# Patient Record
Sex: Female | Born: 1995 | Race: White | Hispanic: No | Marital: Single | Smoking: Former smoker
Health system: Southern US, Community
[De-identification: ages and names within clinical notes are randomized; demographics above are authoritative.]

## PROBLEM LIST (undated history)

## (undated) DIAGNOSIS — R569 Unspecified convulsions: Secondary | ICD-10-CM

## (undated) HISTORY — PX: NO PAST SURGERIES: SHX2092

---
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
## 2016-06-02 ENCOUNTER — Emergency Department
Admission: EM | Admit: 2016-06-02 | Discharge: 2016-06-02 | Discharge status: Against Medical Advice | Payer: Medicaid Other | Attending: Emergency Medicine | Admitting: Emergency Medicine

## 2016-06-02 DIAGNOSIS — R569 Unspecified convulsions: Secondary | ICD-10-CM

## 2016-06-02 DIAGNOSIS — F17200 Nicotine dependence, unspecified, uncomplicated: Secondary | ICD-10-CM | POA: Insufficient documentation

## 2016-06-02 DIAGNOSIS — G40909 Epilepsy, unspecified, not intractable, without status epilepticus: Secondary | ICD-10-CM | POA: Insufficient documentation

## 2016-06-02 DIAGNOSIS — N9489 Other specified conditions associated with female genital organs and menstrual cycle: Secondary | ICD-10-CM | POA: Insufficient documentation

## 2016-06-02 DIAGNOSIS — Z79899 Other long term (current) drug therapy: Secondary | ICD-10-CM | POA: Insufficient documentation

## 2016-06-02 LAB — HCG, QUANTITATIVE, PREGNANCY: hCG, Beta Chain, Quant, S: <1 m[IU]/mL

## 2016-06-02 MED ORDER — LORAZEPAM 2 MG/ML IJ SOLN
INTRAMUSCULAR | Status: AC
  Administered 2016-06-02: 15:00:00
  Filled 2016-06-02: qty 1

## 2016-06-02 MED ORDER — LACOSAMIDE 100 MG PO TABS: 100.0000 mg | ORAL_TABLET | Freq: Two times a day (BID) | ORAL | 0 refills | Status: DC

## 2016-06-02 MED ORDER — SODIUM CHLORIDE 0.9 % IV SOLN
200.0000 mg | Freq: Once | INTRAVENOUS | Status: AC
  Administered 2016-06-02: 200 mg via INTRAVENOUS
  Filled 2016-06-02: qty 20

## 2016-06-02 NOTE — ED Notes (Signed)
Loud noise heard from patient room. Upon entering room patient having seizure that lasted about 45 seconds.    MD notified and at bedside.  Patient given Ativan 2mg  IV.

## 2016-06-02 NOTE — ED Notes (Signed)
Patient upset and requesting to leave.  Dr. Derrill KayGoodman at bedside talking with patient.

## 2016-06-02 NOTE — ED Notes (Addendum)
Patient alert and oriented upon assessment.  VSS.  Patient admits to having seizure disorder and was being treated with lamictal.  Patient reports stopping lamictal because she did not feel it was working.  Patient verbalized last seizure was about 6 months ago and was about the time she stopped taking lamictal.  Patient with bruised chin from fall post procedure.  Upper lip with small laceration that is not bleeding at this time. Seizure pads in place.

## 2016-06-02 NOTE — ED Triage Notes (Signed)
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 Pt appears "groggy" on arrival but is getting more alert as she is being triaged

## 2016-06-02 NOTE — ED Notes (Signed)
Patient postictal at this time.

## 2016-06-02 NOTE — ED Provider Notes (Signed)
[facility] Emergency Department Provider Note   ____________________________________________   I have reviewed the triage vital signs and the nursing notes.   HISTORY  Chief Complaint Seizures   History limited by: Post ictal   HPI [name] is a 20 y.o. female who presented to the emergency department today because of concern for seizure that happened at work. Unfortunatly the patient was post ictal at the time of my exam. I was called into patient's room when she started having a seizure. It was generalized tonic clonic. Per nursing staff the patient states that she had taken herself off of her lamictal. She was apparently at work when she fell to the ground and had a seizure. Per chart review the patient has been seen in the emergency department multiple times for seizures secondary to non compliance.    Past Medical History:  Diagnosis Date  . Seizures [facility](HCC)     Patient Active Problem List   Diagnosis Date Noted  . Seizure (HCC) 04/21/2015    History reviewed. No pertinent surgical history.  Prior to Admission medications   Medication Sig Start Date End Date Taking? Authorizing Provider  etonogestrel (NEXPLANON) 68 MG IMPL implant 1 each by Subdermal route once.    Historical Provider, MD  lacosamide 100 MG TABS Take 1 tablet (100 mg total) by mouth 2 (two) times daily. Patient not taking: Reported on 12/17/2015 04/22/15   Gale Journeyatherine P Walsh, MD  phenytoin (DILANTIN) 100 MG ER capsule Three tablets by mouth at bedtime every night 12/17/15 12/16/16  Emily FilbertJonathan E Williams, MD    Allergies Review of patient's allergies indicates no known allergies.  Family History  Problem Relation Age of Onset  . Seizures Other   . Hypertension Other     Social History Social History  Substance Use Topics  . Smoking status: Current Every Day Smoker    Packs/day: 1.00  . Smokeless tobacco: Current User  . Alcohol use No    Review of Systems Unable to  obtain secondary to post ictal state  ____________________________________________   PHYSICAL EXAM:  VITAL SIGNS: ED Triage Vitals  Enc Vitals Group     BP 06/02/16 1446 106/64     Pulse Rate 06/02/16 1446 (!) 105     Resp 06/02/16 1446 16     Temp 06/02/16 1446 98.1 F (36.7 C)     Temp Source 06/02/16 1446 Oral     SpO2 06/02/16 1446 98 %     Weight 06/02/16 1454 130 lb (59 kg)     Height 06/02/16 1454 4\' 9"  (1.448 m)   Constitutional: Actively seizing upon my entry to the room. Full body convulsions. This did stop spontaneously. Eyes: Slightly disconjugate gaze. Pupils reactive.  ENT   Head: Normocephalic and atraumatic.   Nose: No congestion/rhinnorhea.   Mouth/Throat: Mucous membranes are moist.   Neck: No stridor. Hematological/Lymphatic/Immunilogical: No cervical lymphadenopathy. Cardiovascular: Normal rate, regular rhythm.  No murmurs, rubs, or gallops. Respiratory: Normal respiratory effort without tachypnea nor retractions. Breath sounds are clear and equal bilaterally. No wheezes/rales/rhonchi. Gastrointestinal: Soft and nontender. No distention.  Genitourinary: Deferred Musculoskeletal: Normal range of motion in all extremities. No lower extremity edema. Neurologic:  Actively seizing initially.  Skin:  Skin is warm, dry and intact. No rash noted.  ____________________________________________    LABS (pertinent positives/negatives)  Labs Reviewed  HCG, QUANTITATIVE, PREGNANCY  CBC WITH DIFFERENTIAL/PLATELET  BASIC METABOLIC PANEL     ____________________________________________   EKG  None  ____________________________________________  RADIOLOGY  None  ____________________________________________   PROCEDURES  Procedures  ____________________________________________   INITIAL IMPRESSION / ASSESSMENT AND PLAN / ED COURSE  Pertinent labs & imaging results that were available during my care of the patient were reviewed by  me and considered in my medical decision making (see chart for details).   Patient presents to the emergency department today after apparent seizure at work. The patient started to have a second seizure here in the emergency department. She was given Ativan. She has not been taking her medication. She will be given IV dose of lamictal. Will plan on getting blood work and watching in the emergency department.  ----------------------------------------- 4:58 PM on 06/02/2016 -----------------------------------------  Called into patient's room at this time because she discussed with the nurse that she would like to leave. I did discuss with patient. I did give patient my concern that she can have a further seizure. I did discuss that this further seizure could result in neurologic damage or death. Patient did voice understanding of my concerns. She did however continued to state that she would like to be discharged. She was willing to stay for paperwork and prescription. I did encourage patient to return for any further seizure or concerning symptoms. ____________________________________________   FINAL CLINICAL IMPRESSION(S) / ED DIAGNOSES  Final diagnoses:  Seizure [facility])     Note: This dictation was prepared with Dragon dictation. Any transcriptional errors that result from this process are unintentional    Phineas Semen, MD 06/02/16 1924

## 2016-06-02 NOTE — ED Notes (Signed)
Patient lethargic but when questioned can answer questions appropriately as to where she is, her name, and year.

## 2016-06-02 NOTE — ED Notes (Signed)
Teresita MaduraLacosta Williamson called and notified nurse that she would like to have patient have drug screenign completed due to work related injury.  Stark BrayLacosta also informed nurse that patient has history of seizures and no longer takes medication for seizures.   Aris Georgiaanya Steinhart informed of this information.

## 2016-06-02 NOTE — Discharge Instructions (Signed)
Please seek medical attention for any high fevers, chest pain, shortness of breath, change in behavior, persistent vomiting, bloody stool or any other new or concerning symptoms.  

## 2016-06-16 ENCOUNTER — Emergency Department: Payer: Medicaid Other

## 2016-06-16 ENCOUNTER — Encounter: Payer: Self-pay | Admitting: Emergency Medicine

## 2016-06-16 ENCOUNTER — Emergency Department
Admission: EM | Admit: 2016-06-16 | Discharge: 2016-06-17 | Disposition: A | Discharge status: Home / Self Care | Payer: Medicaid Other | Attending: Emergency Medicine | Admitting: Emergency Medicine

## 2016-06-16 DIAGNOSIS — G40909 Epilepsy, unspecified, not intractable, without status epilepticus: Secondary | ICD-10-CM | POA: Insufficient documentation

## 2016-06-16 DIAGNOSIS — F17200 Nicotine dependence, unspecified, uncomplicated: Secondary | ICD-10-CM | POA: Insufficient documentation

## 2016-06-16 DIAGNOSIS — R569 Unspecified convulsions: Secondary | ICD-10-CM

## 2016-06-16 NOTE — ED Triage Notes (Signed)
Patient to [facility] via EMS from home after seizure activity.  Per EMS family reports patient is non compliant with seizure medications due to way they make her feel.  Seizure tonight and fell forward striking head on night stand.  Patient was postictal on EMS arrival.

## 2016-06-17 NOTE — ED Provider Notes (Signed)
[facility] Emergency Department Provider Note   ____________________________________________   First MD Initiated Contact with Patient 06/16/16 2326     (approximate)  I have reviewed the triage vital signs and the nursing notes.   HISTORY  Chief Complaint Seizures   HPI [name] is a 20 y.o. female with a history of seizure disorders presented with a seizure. Per her boyfriend, the patient was sitting in a chair when she began to seize. He describes as a generalized seizure which lasted about 2 minutes. The patient denies losing bowel or bladder continence. Says that she is supposed to be on Lamictal but does not take it because she says "I know that my seizures will breakthrough this medication." She says that she is also been on Keppra as well as Dilantin in the past, none of which have worsened. [facility] and says that she'll be following up there. Denying any instigating symptoms such as lack of sleep or stress. Says that she has mild aching to the front of her head where she hit her head on a round table causing oozing and swelling.   Past Medical History:  Diagnosis Date  . Seizures [facility](HCC)     Patient Active Problem List   Diagnosis Date Noted  . Seizure (HCC) 04/21/2015    History reviewed. No pertinent surgical history.  Prior to Admission medications   Medication Sig Start Date End Date Taking? Authorizing Provider  etonogestrel (NEXPLANON) 68 MG IMPL implant 1 each by Subdermal route once.    Historical Provider, MD  Lacosamide 100 MG TABS Take 1 tablet (100 mg total) by mouth 2 (two) times daily. 06/02/16   Phineas SemenGraydon Goodman, MD  phenytoin (DILANTIN) 100 MG ER capsule Three tablets by mouth at bedtime every night 12/17/15 12/16/16  Emily FilbertJonathan E Williams, MD    Allergies Review of patient's allergies indicates no known allergies.  Family History  Problem Relation Age of Onset  . Seizures Other   . Hypertension Other      Social History Social History  Substance Use Topics  . Smoking status: Current Every Day Smoker    Packs/day: 1.00  . Smokeless tobacco: Current User  . Alcohol use Yes    Review of Systems Constitutional: No fever/chills Eyes: No visual changes. ENT: No sore throat. Cardiovascular: Denies chest pain. Respiratory: Denies shortness of breath. Gastrointestinal: No abdominal pain.  No nausea, no vomiting.  No diarrhea.  No constipation. Genitourinary: Negative for dysuria. Musculoskeletal: Negative for back pain. Skin: Negative for rash. Neurological: Negative for focal weakness or numbness.  10-point ROS otherwise negative.  ____________________________________________   PHYSICAL EXAM:  VITAL SIGNS: ED Triage Vitals  Enc Vitals Group     BP 06/16/16 2200 99/63     Pulse Rate 06/16/16 2200 (!) 107     Resp 06/16/16 2200 13     Temp 06/16/16 2204 98.5 F (36.9 C)     Temp Source 06/16/16 2204 Oral     SpO2 06/16/16 2200 100 %     Weight --      Height --      Head Circumference --      Peak Flow --      Pain Score --      Pain Loc --      Pain Edu? --      Excl. in GC? --     Constitutional: Alert and oriented. Well appearing and in no acute distress. Eyes: Conjunctivae  are normal. PERRL. EOMI. Head:4 x 5 cm hematoma that is to the fore head. Small abrasion overlying. No depression. Nose: No congestion/rhinnorhea. Mouth/Throat: Mucous membranes are moist.  Oropharynx non-erythematous. No tongue bite. Neck: No stridor.  No tenderness to midline cervical spine. No deformity or step-off. Cardiovascular: Normal rate, regular rhythm. Heart rate of 65 on the monitor during my exam. Grossly normal heart sounds.   Respiratory: Normal respiratory effort.  No retractions. Lungs CTAB. Gastrointestinal: Soft and nontender. No distention.  Musculoskeletal: No lower extremity tenderness nor edema.  No joint effusions. Neurologic:  Normal speech and language. No gross  focal neurologic deficits are appreciated. Skin:  Skin is warm, dry and intact. No rash noted. Psychiatric: Mood and affect are normal. Speech and behavior are normal.  ____________________________________________   LABS (all labs ordered are listed, but only abnormal results are displayed)  Labs Reviewed - No data to display ____________________________________________  EKG  ED ECG REPORT I, Schaevitz,  Teena Irani, the attending physician, personally viewed and interpreted this ECG.   Date: 06/17/2016  EKG Time: 2154  Rate: 113  Rhythm: sinus tachycardia  Axis: Normal axis  Intervals:none  ST&T Change: No ST segment elevation or depression. T-wave inversions in 3, aVF as well as V3. No significant change from an EKG in the record from 04/24/2015. ____________________________________________  RADIOLOGY  CT Head Wo Contrast (Accession [id number]) (Order [id number])  Imaging  Date: 06/16/2016 Department: [facility] EMERGENCY DEPARTMENT Released By: Blanca Friend, RN (auto-released) Authorizing: Sharman Cheek, MD  Exam Information   Status Exam Begun  Exam Ended   Final [99] 06/16/2016 10:39 PM 06/16/2016 10:46 PM  PACS Images   Show images for CT Head Wo Contrast  Study Result   CLINICAL DATA:  Status post seizure. Hit forehead on nightstand. Initial encounter.  EXAM: CT HEAD WITHOUT CONTRAST  TECHNIQUE: Contiguous axial images were obtained from the base of the skull through the vertex without intravenous contrast.  COMPARISON:  CT of the head performed 12/17/2015  FINDINGS: Brain: No evidence of acute infarction, hemorrhage, hydrocephalus, extra-axial collection or mass lesion/mass effect.  The posterior fossa, including the cerebellum, brainstem and fourth ventricle, is within normal limits. The third and lateral ventricles, and basal ganglia are unremarkable in appearance. The cerebral hemispheres are symmetric in appearance,  with normal gray-white differentiation. No mass effect or midline shift is seen.  Vascular: No hyperdense vessel or unexpected calcification.  Skull: There is no evidence of fracture; visualized osseous structures are unremarkable in appearance.  Sinuses/Orbits: The visualized portions of the orbits are within normal limits. The paranasal sinuses and mastoid air cells are well-aerated.  Other: Mild soft tissue swelling is noted overlying the right frontal calvarium.  IMPRESSION: 1. No acute intracranial pathology seen on CT. 2. Mild soft tissue swelling overlying the right frontal calvarium.   Electronically Signed   By: Roanna Raider M.D.   On: 06/16/2016 22:52     ____________________________________________   PROCEDURES  Procedure(s) performed:   Procedures  Critical Care performed:   ____________________________________________   INITIAL IMPRESSION / ASSESSMENT AND PLAN / ED COURSE  Pertinent labs & imaging results that were available during my care of the patient were reviewed by me and considered in my medical decision making (see chart for details).  No serious pathology found on the CAT scan. I offered to the patient to call our neurology service to see if there would be any alternative medication that would be appropriate for her. However, she  would not like this at this time and says that she would rather follow up with her neurologist at [facility]. She says that she does not drive. She'll be discharged home. She has capacity to make decisions at this time. She is not postictal. She says that she'll be able to follow-up easily at [facility] with her known neurologist.  Clinical Course     ____________________________________________   FINAL CLINICAL IMPRESSION(S) / ED DIAGNOSES  Seizure.    NEW MEDICATIONS STARTED DURING THIS VISIT:  New Prescriptions   No medications on file     Note:  This document was prepared using Dragon voice  recognition software and may include unintentional dictation errors.    Myrna Blazer, MD 06/17/16 [phone]

## 2017-02-24 ENCOUNTER — Emergency Department
Admission: EM | Admit: 2017-02-24 | Discharge: 2017-02-24 | Disposition: A | Discharge status: Home / Self Care | Payer: Medicaid Other | Attending: Emergency Medicine | Admitting: Emergency Medicine

## 2017-02-24 DIAGNOSIS — Z8669 Personal history of other diseases of the nervous system and sense organs: Secondary | ICD-10-CM | POA: Insufficient documentation

## 2017-02-24 DIAGNOSIS — F17200 Nicotine dependence, unspecified, uncomplicated: Secondary | ICD-10-CM | POA: Insufficient documentation

## 2017-02-24 DIAGNOSIS — Z79899 Other long term (current) drug therapy: Secondary | ICD-10-CM | POA: Insufficient documentation

## 2017-02-24 DIAGNOSIS — Z0000 Encounter for general adult medical examination without abnormal findings: Secondary | ICD-10-CM | POA: Insufficient documentation

## 2017-02-24 LAB — BASIC METABOLIC PANEL
Anion gap: 8 (ref 5–15)
BUN: 8 mg/dL (ref 6–20)
CO2: 26 mmol/L (ref 22–32)
CREATININE: 0.67 mg/dL (ref 0.44–1.00)
Calcium: 9.2 mg/dL (ref 8.9–10.3)
Chloride: 105 mmol/L (ref 101–111)
GFR calc Af Amer: >60 mL/min (ref >60)
GLUCOSE: 97 mg/dL (ref 65–99)
POTASSIUM: 3.6 mmol/L (ref 3.5–5.1)
SODIUM: 139 mmol/L (ref 135–145)

## 2017-02-24 NOTE — ED Provider Notes (Signed)
[facility] Emergency Department Provider Note    ____________________________________________   I have reviewed the triage vital signs and the nursing notes.   HISTORY  Chief Complaint Medical Clearance   History limited by: Not Limited   HPI [name] is a 21 y.o. female who presents to the emergency department today because she requires a doctor's note before her employer will allow her to return. Patient states that she did have a seizure last week. She has a history of epilepsy and has a seizure once every few months. She states this felt like her normal seizure. She did have call out for work that day. Currently since that time work is asked that she have a work note saying that she can return to work. Patient has tried contacting neurologist but does not have an appointment scheduled until October. Patient denies any illnesses.   Past Medical History:  Diagnosis Date  . Seizures [facility](HCC)     Patient Active Problem List   Diagnosis Date Noted  . Seizure (HCC) 04/21/2015    History reviewed. No pertinent surgical history.  Prior to Admission medications   Medication Sig Start Date End Date Taking? Authorizing Provider  etonogestrel (NEXPLANON) 68 MG IMPL implant 1 each by Subdermal route once.    [provider]  Lacosamide 100 MG TABS Take 1 tablet (100 mg total) by mouth 2 (two) times daily. 06/02/16   Phineas SemenGoodman, Radames Mejorado, MD  phenytoin (DILANTIN) 100 MG ER capsule Three tablets by mouth at bedtime every night 12/17/15 12/16/16  Emily FilbertWilliams, Jonathan E, MD    Allergies Patient has no known allergies.  Family History  Problem Relation Age of Onset  . Seizures Other   . Hypertension Other     Social History Social History  Substance Use Topics  . Smoking status: Current Every Day Smoker    Packs/day: 1.00  . Smokeless tobacco: Current User  . Alcohol use Yes    Review of Systems Constitutional: No fever/chills Eyes: No visual  changes. ENT: No sore throat. Cardiovascular: Denies chest pain. Respiratory: Denies shortness of breath. Gastrointestinal: No abdominal pain.  No nausea, no vomiting.  No diarrhea.   Genitourinary: Negative for dysuria. Musculoskeletal: Negative for back pain. Skin: Negative for rash. Neurological: Positive for seizure.  ____________________________________________   PHYSICAL EXAM:  VITAL SIGNS: ED Triage Vitals  Enc Vitals Group     BP 02/24/17 1259 96/66     Pulse Rate 02/24/17 1259 89     Resp 02/24/17 1259 16     Temp 02/24/17 1259 98.5 F (36.9 C)     Temp Source 02/24/17 1259 Oral     SpO2 02/24/17 1259 100 %     Weight 02/24/17 1259 135 lb (61.2 kg)     Height 02/24/17 1259 4\' 10"  (1.473 m)     Head Circumference --      Peak Flow --      Pain Score 02/24/17 1301 0   Constitutional: Alert and oriented. Well appearing and in no distress. Eyes: Conjunctivae are normal.  ENT   Head: Normocephalic and atraumatic.   Nose: No congestion/rhinnorhea.   Mouth/Throat: Mucous membranes are moist.   Neck: No stridor. Hematological/Lymphatic/Immunilogical: No cervical lymphadenopathy. Cardiovascular: Normal rate, regular rhythm.  No murmurs, rubs, or gallops.  Respiratory: Normal respiratory effort without tachypnea nor retractions. Breath sounds are clear and equal bilaterally. No wheezes/rales/rhonchi. Gastrointestinal: Soft and non tender. No rebound. No guarding.  Genitourinary: Deferred Musculoskeletal: Normal range of motion in  all extremities. No lower extremity edema. Neurologic:  Normal speech and language. No gross focal neurologic deficits are appreciated.  Skin:  Skin is warm, dry and intact. No rash noted. Psychiatric: Mood and affect are normal. Speech and behavior are normal. Patient exhibits appropriate insight and judgment.  ____________________________________________    LABS (pertinent positives/negatives)  Labs Reviewed  BASIC METABOLIC  PANEL     ____________________________________________   EKG   I, Phineas Semen, attending physician, personally viewed and interpreted this EKG  EKG Time: 1317 Rate: 69 Rhythm: normal sinus rhythm Axis: normal Intervals: qtc 424 QRS: narrow ST changes: no st elevation Impression: normal ekg   ____________________________________________    RADIOLOGY  None  ____________________________________________   PROCEDURES  Procedures  ____________________________________________   INITIAL IMPRESSION / ASSESSMENT AND PLAN / ED COURSE  Pertinent labs & imaging results that were available during my care of the patient were reviewed by me and considered in my medical decision making (see chart for details).  Patient presented to the emergency department today because she requires a note to go back to work. At this point I feel patient can go back to work.  ____________________________________________   FINAL CLINICAL IMPRESSION(S) / ED DIAGNOSES  Final diagnoses:  Well adult exam     Note: This dictation was prepared with Dragon dictation. Any transcriptional errors that result from this process are unintentional     Phineas Semen, MD 02/24/17 1413

## 2017-02-24 NOTE — ED Triage Notes (Signed)
Pt reports to ED w/ c/o medical clearance for work. Pt sts that her work requires her to be cleared to be able to "work a full work week".  Pt sts that she has hx of seizures, that she does have neurologist. Pt sts she is unable to see her neurologist until October. Pt denies being of any antiseizure medications. Pt denies CP. SOB, n/v/d,. NAD.

## 2017-02-24 NOTE — ED Notes (Signed)
AAOx3.  Skin warm and dry.  NAD 

## 2017-02-24 NOTE — ED Notes (Signed)
Patient denies pain and is resting comfortably.  

## 2017-02-24 NOTE — Discharge Instructions (Signed)
Please seek medical attention for any high fevers, chest pain, shortness of breath, change in behavior, persistent vomiting, bloody stool or any other new or concerning symptoms.  

## 2017-03-05 IMAGING — CT CT HEAD W/O CM
3 series · 15 of 45 positions shown, 18 images · non-contrast
Comparison: CT of the head performed 12/17/2015

CLINICAL DATA: Status post seizure. Hit forehead on nightstand.
Initial encounter.

EXAM:
CT HEAD WITHOUT CONTRAST
TECHNIQUE: Contiguous axial images were obtained from the base of the skull
through the vertex without intravenous contrast.

[Series 2: head wo · axial · 0.47mm/px · z∈[-157,-42]mm · 9 of 28 slices shown, 12 images]
[im 3/28  brain]
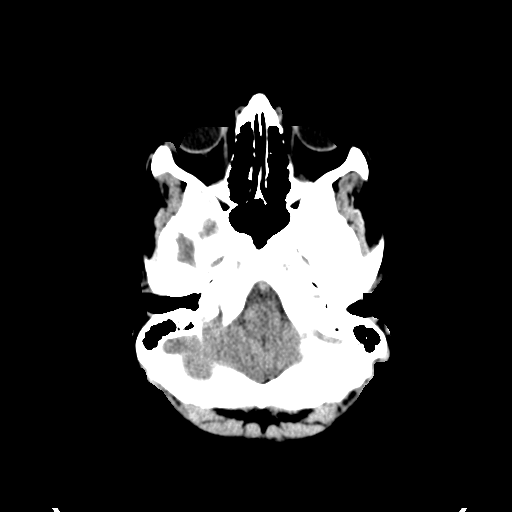
[im 3/28  bone]
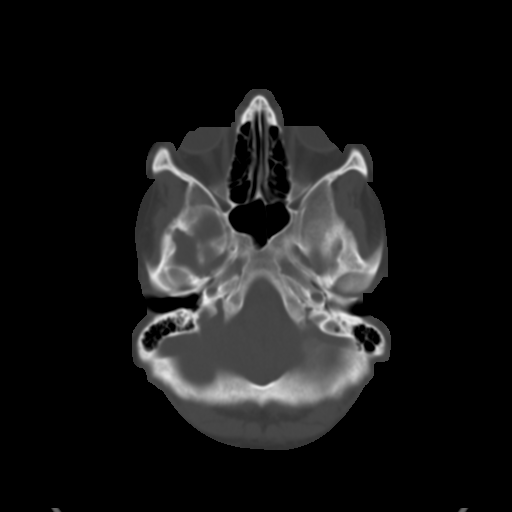
[im 6/28  brain]
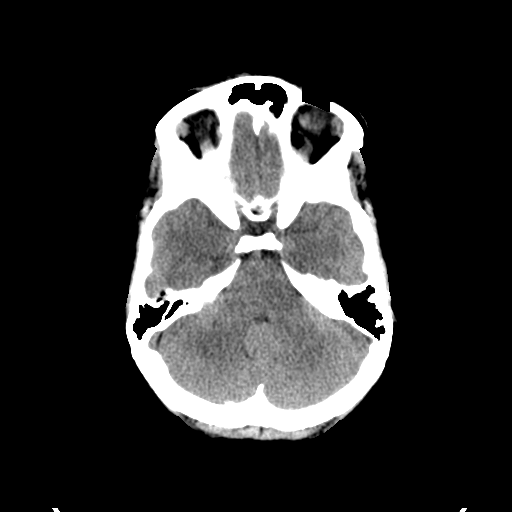
[im 9/28  brain]
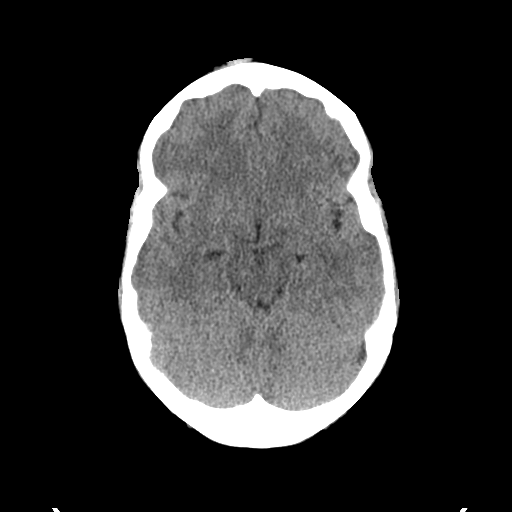
[im 12/28  brain]
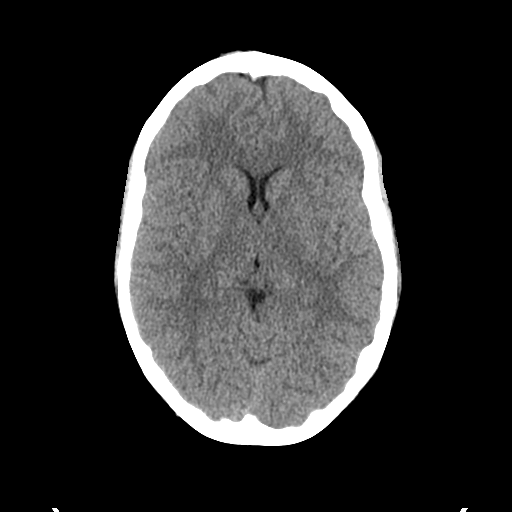
[im 15/28  brain]
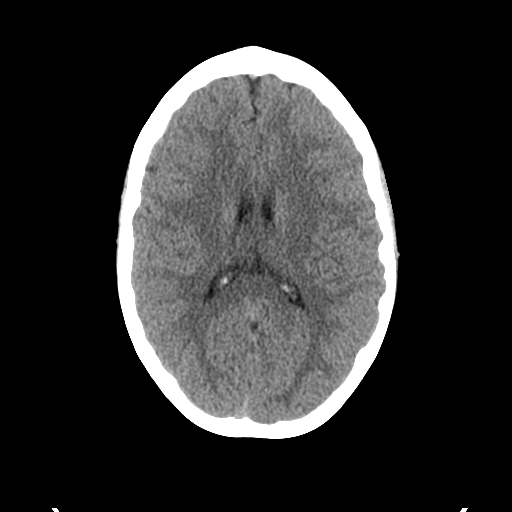
[im 15/28  bone]
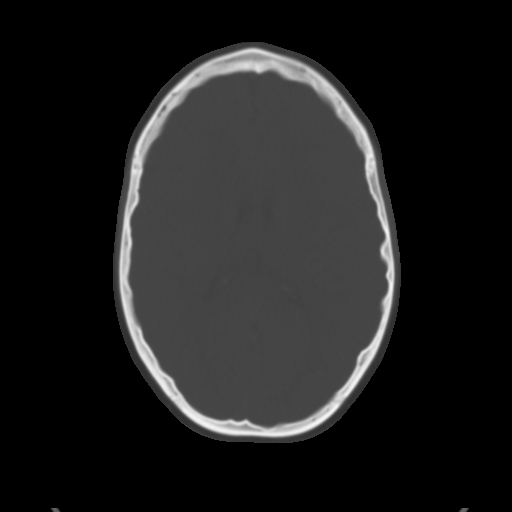
[im 17/28  brain]
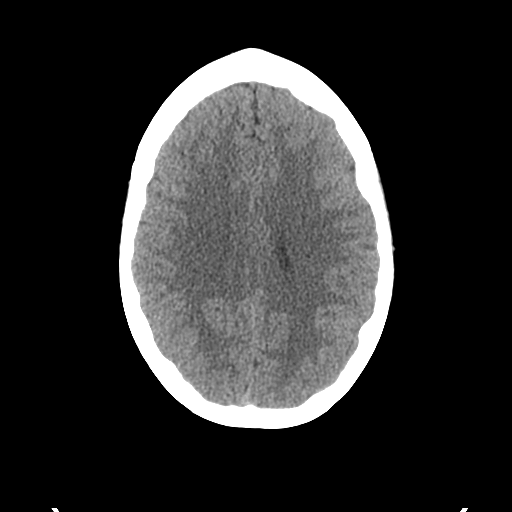
[im 20/28  brain]
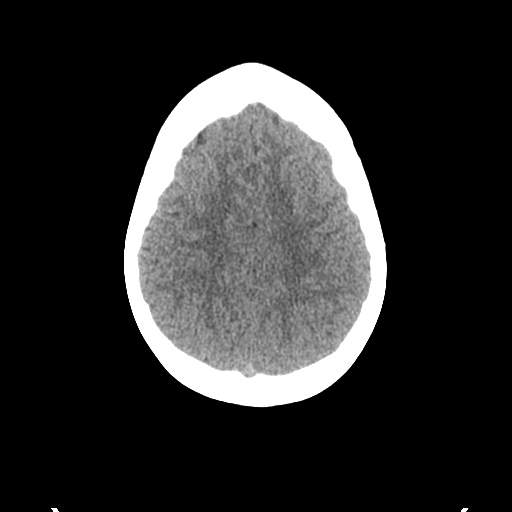
[im 23/28  brain]
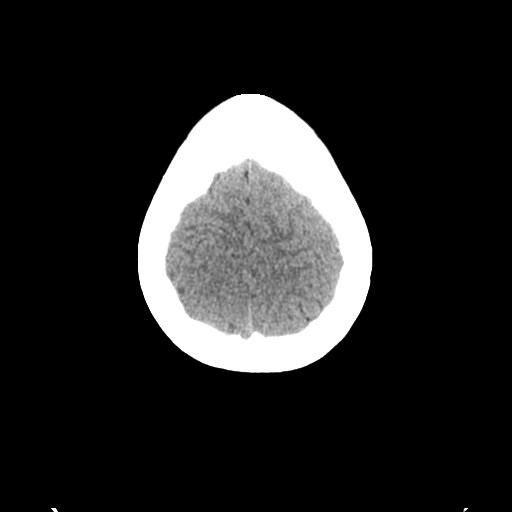
[im 26/28  brain]
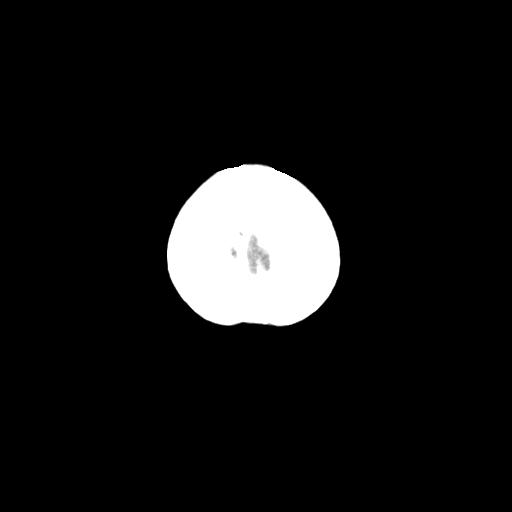
[im 26/28  bone]
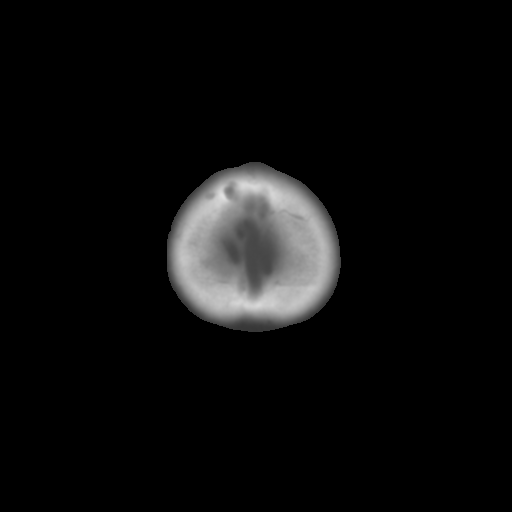

[Series 4: coronal soft tissue · coronal · 0.28mm/px · 3 of 63 slices shown]
[im 21/63  brain]
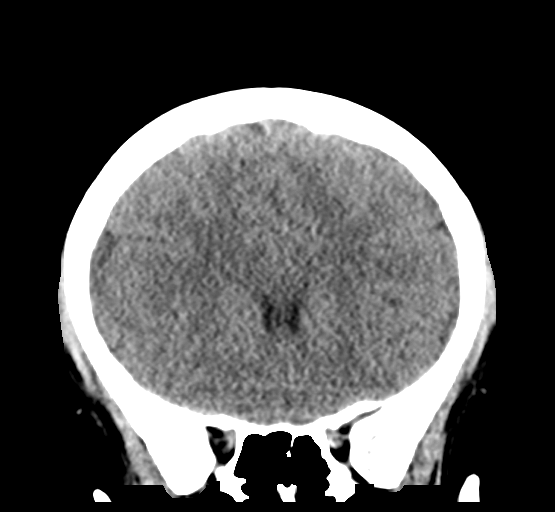
[im 28/63  brain]
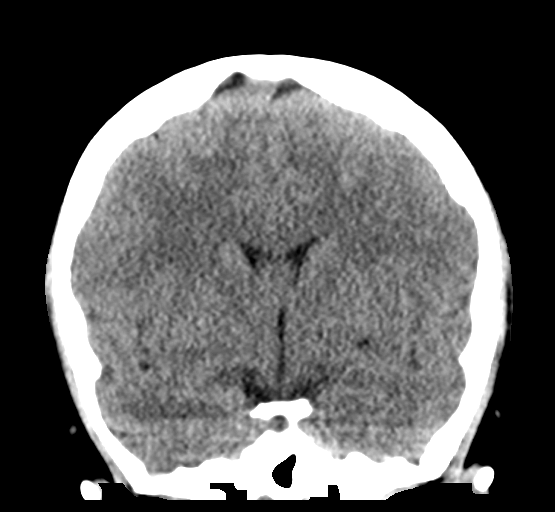
[im 35/63  brain]
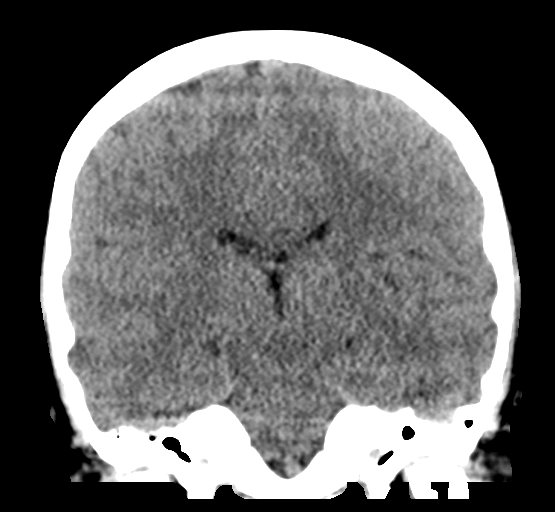

[Series 5: sagittal soft tissue · sagittal · 0.28mm/px · 3 of 46 slices shown]
[im 16/46  brain]
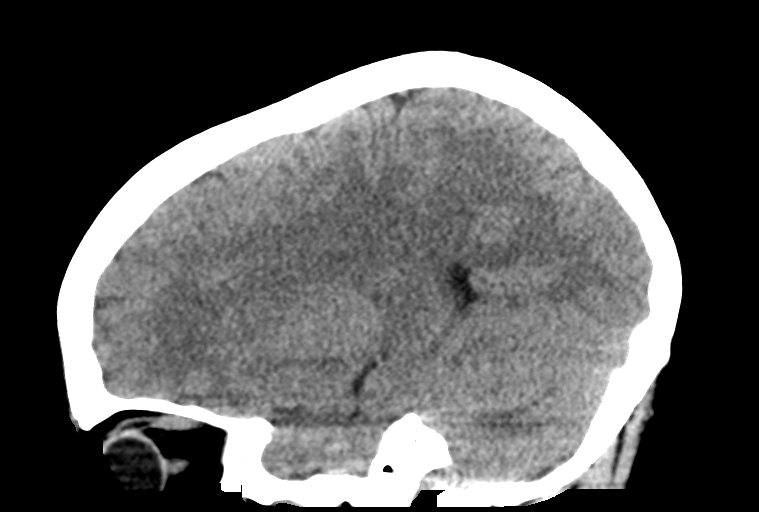
[im 23/46  brain]
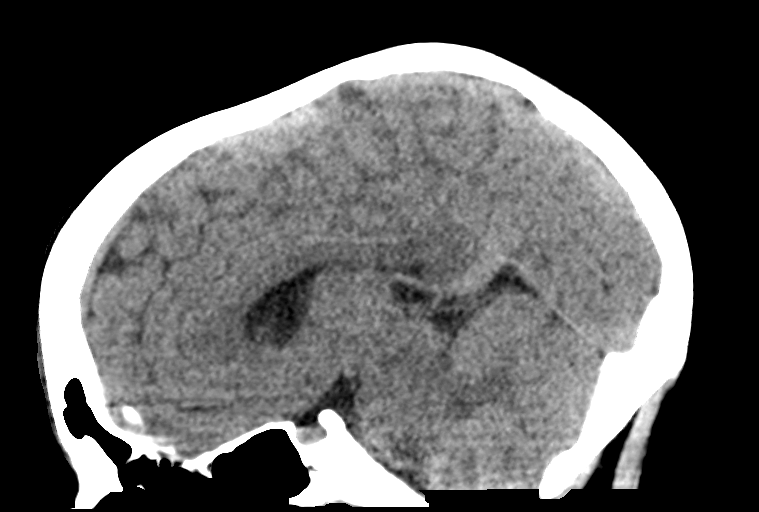
[im 31/46  brain]
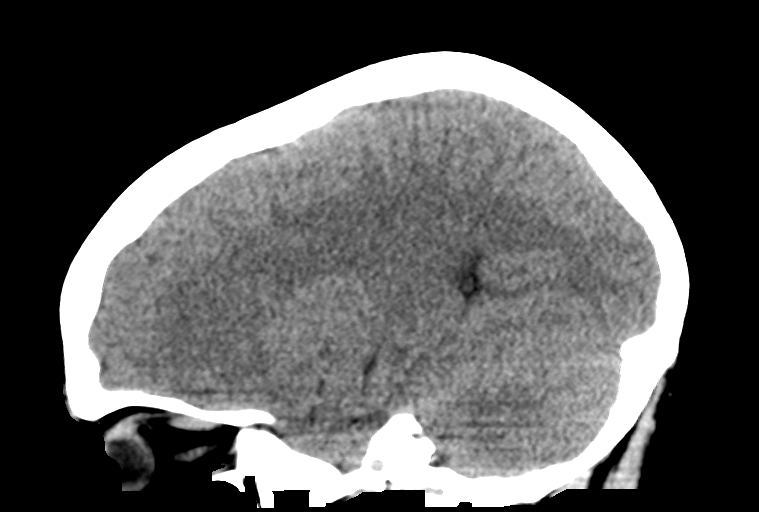

[15 of 45 positions shown; findings below may reference images not displayed]

FINDINGS: Brain: No evidence of acute infarction, hemorrhage, hydrocephalus,
extra-axial collection or mass lesion/mass effect.

The posterior fossa, including the cerebellum, brainstem and fourth
ventricle, is within normal limits. The third and lateral
ventricles, and basal ganglia are unremarkable in appearance. The
cerebral hemispheres are symmetric in appearance, with normal
gray-white differentiation. No mass effect or midline shift is seen.

Vascular: No hyperdense vessel or unexpected calcification.

Skull: There is no evidence of fracture; visualized osseous
structures are unremarkable in appearance.

Sinuses/Orbits: The visualized portions of the orbits are within
normal limits. The paranasal sinuses and mastoid air cells are
well-aerated.

Other: Mild soft tissue swelling is noted overlying the right
frontal calvarium.
IMPRESSION: 1. No acute intracranial pathology seen on CT.
2. Mild soft tissue swelling overlying the right frontal calvarium.

## 2017-07-07 ENCOUNTER — Encounter: Payer: Self-pay | Admitting: Emergency Medicine

## 2017-07-07 ENCOUNTER — Emergency Department
Admission: EM | Admit: 2017-07-07 | Discharge: 2017-07-07 | Disposition: A | Discharge status: Home / Self Care | Payer: Self-pay | Attending: Emergency Medicine | Admitting: Emergency Medicine

## 2017-07-07 DIAGNOSIS — Z79899 Other long term (current) drug therapy: Secondary | ICD-10-CM | POA: Insufficient documentation

## 2017-07-07 DIAGNOSIS — F17200 Nicotine dependence, unspecified, uncomplicated: Secondary | ICD-10-CM | POA: Insufficient documentation

## 2017-07-07 DIAGNOSIS — G40909 Epilepsy, unspecified, not intractable, without status epilepticus: Secondary | ICD-10-CM | POA: Insufficient documentation

## 2017-07-07 LAB — CBC WITH DIFFERENTIAL/PLATELET
BASOS PCT: 0 %
Basophils Absolute: 0 10*3/uL (ref 0–0.1)
EOS ABS: 0.1 10*3/uL (ref 0–0.7)
Eosinophils Relative: 1 %
HEMATOCRIT: 39.1 % (ref 35.0–47.0)
HEMOGLOBIN: 12.9 g/dL (ref 12.0–16.0)
LYMPHS ABS: 1.7 10*3/uL (ref 1.0–3.6)
Lymphocytes Relative: 26 %
MCH: 30.7 pg (ref 26.0–34.0)
MCHC: 32.9 g/dL (ref 32.0–36.0)
MCV: 93.1 fL (ref 80.0–100.0)
MONO ABS: 0.3 10*3/uL (ref 0.2–0.9)
MONOS PCT: 5 %
Neutro Abs: 4.5 10*3/uL (ref 1.4–6.5)
Neutrophils Relative %: 68 %
Platelets: 262 10*3/uL (ref 150–440)
RBC: 4.2 MIL/uL (ref 3.80–5.20)
RDW: 13.5 % (ref 11.5–14.5)
WBC: 6.6 10*3/uL (ref 3.6–11.0)

## 2017-07-07 LAB — COMPREHENSIVE METABOLIC PANEL
ALBUMIN: 4.2 g/dL (ref 3.5–5.0)
ALK PHOS: 63 U/L (ref 38–126)
ALT: 38 U/L (ref 14–54)
ANION GAP: 10 (ref 5–15)
AST: 50 U/L — ABNORMAL HIGH (ref 15–41)
BILIRUBIN TOTAL: 0.5 mg/dL (ref 0.3–1.2)
BUN: 11 mg/dL (ref 6–20)
CALCIUM: 9.2 mg/dL (ref 8.9–10.3)
CO2: 23 mmol/L (ref 22–32)
Chloride: 106 mmol/L (ref 101–111)
Creatinine, Ser: 0.77 mg/dL (ref 0.44–1.00)
GFR calc non Af Amer: >60 mL/min (ref >60)
Glucose, Bld: 100 mg/dL — ABNORMAL HIGH (ref 65–99)
POTASSIUM: 4.1 mmol/L (ref 3.5–5.1)
SODIUM: 139 mmol/L (ref 135–145)
TOTAL PROTEIN: 7.2 g/dL (ref 6.5–8.1)

## 2017-07-07 MED ORDER — SODIUM CHLORIDE 0.9 % IV SOLN
1000.0000 mg | Freq: Once | INTRAVENOUS | Status: AC
  Administered 2017-07-07: 1000 mg via INTRAVENOUS
  Filled 2017-07-07: qty 20

## 2017-07-07 MED ORDER — LAMOTRIGINE 25 MG PO TABS
25.0000 mg | ORAL_TABLET | Freq: Once | ORAL | Status: AC
  Administered 2017-07-07: 25 mg via ORAL
  Filled 2017-07-07: qty 1

## 2017-07-07 MED ORDER — SODIUM CHLORIDE 0.9 % IV BOLUS (SEPSIS)
500.0000 mL | Freq: Once | INTRAVENOUS | Status: AC
  Administered 2017-07-07: 500 mL via INTRAVENOUS

## 2017-07-07 NOTE — ED Provider Notes (Addendum)
[facility] Emergency Department Provider Note ______[facility]______________________________________   First MD Initiated Contact with Patient 07/07/17 1637     (approximate)  I have reviewed the triage vital signs and the nursing notes.   HISTORY  Chief Complaint Seizures    HPI [name] is a 21 y.o. female with a prior history of seizure disorder not currently on any medication who presents with apparent seizure, acute onset, generalized, occurring while she was at home, and witnessed by her boyfriend.  Patient denies headache, weakness, or any other symptoms currently.  She states her last seizure was a while ago.  She denies any specific precipitating factors, but states she has not been on her medication for some time.   Past Medical History:  Diagnosis Date  . Seizures [facility](HCC)     Patient Active Problem List   Diagnosis Date Noted  . Seizure (HCC) 04/21/2015    History reviewed. No pertinent surgical history.  Prior to Admission medications   Medication Sig Start Date End Date Taking? Authorizing Provider  etonogestrel (NEXPLANON) 68 MG IMPL implant 1 each by Subdermal route once.    [provider]  Lacosamide 100 MG TABS Take 1 tablet (100 mg total) by mouth 2 (two) times daily. 06/02/16   Phineas SemenGoodman, Graydon, MD  phenytoin (DILANTIN) 100 MG ER capsule Three tablets by mouth at bedtime every night 12/17/15 12/16/16  Emily FilbertWilliams, Jonathan E, MD    Allergies Patient has no known allergies.  Family History  Problem Relation Age of Onset  . Seizures Other   . Hypertension Other     Social History Social History  Substance Use Topics  . Smoking status: Current Every Day Smoker    Packs/day: 1.00  . Smokeless tobacco: Current User  . Alcohol use Yes    Review of Systems  Constitutional: No fever. Eyes: No visual changes. ENT: No neck pain. Cardiovascular: Denies chest pain. Respiratory: Denies shortness of breath. Gastrointestinal:  No nausea, no vomiting.   Genitourinary: Negative for dysuria.  Musculoskeletal: Negative for back pain. Skin: Negative for rash. Neurological: Negative for headache.   ____________________________________________   PHYSICAL EXAM:  VITAL SIGNS: ED Triage Vitals  Enc Vitals Group     BP 07/07/17 1629 121/67     Pulse Rate 07/07/17 1629 (!) 113     Resp 07/07/17 1629 18     Temp 07/07/17 1629 (!) 97.4 F (36.3 C)     Temp Source 07/07/17 1629 Oral     SpO2 07/07/17 1629 95 %     Weight 07/07/17 1625 135 lb (61.2 kg)     Height 07/07/17 1625 4\' 9"  (1.448 m)     Head Circumference --      Peak Flow --      Pain Score --      Pain Loc --      Pain Edu? --      Excl. in GC? --     Constitutional: Alert and oriented. Well appearing and in no acute distress. Eyes: Conjunctivae are normal.  EOMI. PERRLA.  Head: Atraumatic. Nose: No congestion/rhinnorhea. Mouth/Throat: Mucous membranes are moist.   Neck: Normal range of motion.  Cspine nontender.  Cardiovascular: Good peripheral circulation. Respiratory: Normal respiratory effort.  No retractions. Gastrointestinal: No distention.  Musculoskeletal: Extremities warm and well perfused.  Neurologic:  Normal speech and language. No gross focal neurologic deficits are appreciated. Motor and sensory intact in all extremities.  Normal coordination.   Skin:  Skin is warm  and dry. No rash noted. Psychiatric: Mood and affect are normal. Speech and behavior are normal.  ____________________________________________   LABS (all labs ordered are listed, but only abnormal results are displayed)  Labs Reviewed  COMPREHENSIVE METABOLIC PANEL - Abnormal; Notable for the following:       Result Value   Glucose, Bld 100 (*)    AST 50 (*)    All other components within normal limits  CBC WITH DIFFERENTIAL/PLATELET    ____________________________________________  EKG   ____________________________________________  RADIOLOGY    ____________________________________________   PROCEDURES  Procedure(s) performed: No    Critical Care performed: No ____________________________________________   INITIAL IMPRESSION / ASSESSMENT AND PLAN / ED COURSE  Pertinent labs & imaging results that were available during my care of the patient were reviewed by me and considered in my medical decision making (see chart for details).  21 year old female with history of seizure disorder who is chronically noncompliant with her antiepileptic presents with a seizure today.  Patient is now a&x3 and denies any acute complaints.  She states her last seizure was some time ago.  On review of past medical records in Epic, patient had several visits in 2017 for seizures and was also intermittently compliant with her medications at that time.  Patient states she also has not seen a neurologist in some time, but does have insurance and has access to see a neurologist at [facility].  On exam, vital signs are normal except for slight tachycardia, patient is well-appearing, and the neurologic exam is nonfocal.  Patient is not postictal.  Patient is consistent with seizure with known seizure disorder.  Given the patient is chronically noncompliant with her medications there is no indication to try to load or start a medication here from the emergency department.  Will obtain basic labs to rule out other precipitating cause, observe patient briefly in the ED and if she remains stable will discharge with recommendation to follow-up with her regular neurologist.    ----------------------------------------- 5:57 PM on 07/07/2017 -----------------------------------------  She remains asymptomatic in the emergency department.  I discussed with patient and her mother that given that patient is chronically noncompliant and has been on  multiple medication regimens (and has not been on medication for months) there is no indication to prescribe outpatient medications from the emergency department - she must follow-up with her neurologist to reestablish care and be restarted on an antiepileptic medication and monitored.  However, we will give a loading dose of Dilantin and a dose of Lamictal here in the emergency department to prevent acute recurrence of seizures.  Patient instructed that she needs to follow-up as soon as possible.  Return precautions given.  ____________________________________________   FINAL CLINICAL IMPRESSION(S) / ED DIAGNOSES  Final diagnoses:  Seizure disorder (HCC)      NEW MEDICATIONS STARTED DURING THIS VISIT:  New Prescriptions   No medications on file     Note:  This document was prepared using Dragon voice recognition software and may include unintentional dictation errors.    Dionne Bucy, MD 07/07/17 Inda Castle, MD 07/07/17 [phone]

## 2017-07-07 NOTE — ED Notes (Signed)
Pt states she her BP normally is low, denies any dizziness or chest pain or SHOB. MD aware, states okay for D/C.

## 2017-07-07 NOTE — ED Notes (Signed)
NAD noted at time of D/C. Pt taken to lobby via wheelchair by family. Pt and family deny any comments/concerns at this time.

## 2017-07-07 NOTE — ED Triage Notes (Signed)
Pt presents to ED via [facility] with c/o seizures. Per EMS pt had seizure earlier today lasting approx 1 min witnessed by her boyfriend. EMS states pt was postictal on their arrival, upon arrival to hospital pt is alert and oriented x 4. Pt denies any pain at this time.

## 2017-07-07 NOTE — ED Notes (Addendum)
This RN called to bedside, pt c/o burning with Dilantin running through IV, this RN checked IV patency. IV noted to be patent, this RN spoke with MD, VORB for 500cc's NS to be run with Dilantin to see if that aids with the burning. Pt assisted to the bathroom by her mother at this time.

## 2017-07-07 NOTE — Discharge Instructions (Signed)
Follow-up with your regular neurologist as soon as you are able to.  Return to the emergency department for new, recurrent, or persistent seizures, or any other new or worsening symptoms that concern you.

## 2018-02-01 ENCOUNTER — Encounter: Payer: Self-pay | Admitting: Emergency Medicine

## 2018-02-01 ENCOUNTER — Other Ambulatory Visit: Payer: Self-pay

## 2018-02-01 DIAGNOSIS — Y999 Unspecified external cause status: Secondary | ICD-10-CM | POA: Insufficient documentation

## 2018-02-01 DIAGNOSIS — F17210 Nicotine dependence, cigarettes, uncomplicated: Secondary | ICD-10-CM | POA: Insufficient documentation

## 2018-02-01 DIAGNOSIS — Y929 Unspecified place or not applicable: Secondary | ICD-10-CM | POA: Insufficient documentation

## 2018-02-01 DIAGNOSIS — Y939 Activity, unspecified: Secondary | ICD-10-CM | POA: Insufficient documentation

## 2018-02-01 DIAGNOSIS — X500XXA Overexertion from strenuous movement or load, initial encounter: Secondary | ICD-10-CM | POA: Insufficient documentation

## 2018-02-01 DIAGNOSIS — S39012A Strain of muscle, fascia and tendon of lower back, initial encounter: Secondary | ICD-10-CM | POA: Insufficient documentation

## 2018-02-01 NOTE — ED Triage Notes (Addendum)
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

## 2018-02-02 ENCOUNTER — Emergency Department
Admission: EM | Admit: 2018-02-02 | Discharge: 2018-02-02 | Disposition: A | Discharge status: Home / Self Care | Payer: Self-pay | Attending: Emergency Medicine | Admitting: Emergency Medicine

## 2018-02-02 DIAGNOSIS — S39012A Strain of muscle, fascia and tendon of lower back, initial encounter: Secondary | ICD-10-CM

## 2018-02-02 MED ORDER — LIDOCAINE 5 % EX PTCH
1.0000 | MEDICATED_PATCH | CUTANEOUS | Status: DC
  Administered 2018-02-02: 1 via TRANSDERMAL
  Filled 2018-02-02: qty 1

## 2018-02-02 MED ORDER — LIDOCAINE 5 % EX PTCH: 1.0000 | MEDICATED_PATCH | Freq: Two times a day (BID) | CUTANEOUS | 0 refills | Status: DC

## 2018-02-02 NOTE — ED Notes (Signed)
Pt discharged to home.  Family member driving.  Discharge instructions reviewed.  Verbalized understanding.  No questions or concerns at this time.  Teach back verified.  Pt in NAD.  No items left in ED.   

## 2018-02-02 NOTE — Discharge Instructions (Signed)
You have been seen in the Emergency Department (ED)  today for back pain.  You are likely suffering from muscle strain.  Please take Motrin (ibuprofen) as needed for your pain according to the instructions written on the box.  Alternatively, for the next five days you can take  three times daily with meals (it may upset your stomach).  Please follow up with your doctor as soon as possible regarding today's ED visit and your back pain.  Return to the ED for worsening back pain, fever, weakness or numbness of either leg, or if you develop either (1) an inability to urinate or have bowel movements, or (2) loss of your ability to control your bathroom functions (if you start having "accidents"), or if you develop other new symptoms that concern you.

## 2018-02-02 NOTE — ED Notes (Signed)
Pt states low back pain off and on for over a decade. Pt states pain is worse over last few weeks. Pt appears in no acute distress, ambulatory without difficutly. Pt laughing and joking with friends while assessment occurring. Po fluids provided and blankets provided to pt and all four visitors in room.

## 2018-02-02 NOTE — ED Provider Notes (Signed)
[facility] Emergency Department Provider Note  ____________________________________________   First MD Initiated Contact with Patient 02/02/18 0300     (approximate)  I have reviewed the triage vital signs and the nursing notes.   HISTORY  Chief Complaint Back Pain    HPI [name] is a 22 y.o. female whose medical history includes a seizure disorder on no antiepileptic medications who presents for evaluation of low back pain that has been occurring on and off since middle school, which is more than a decade.  However it is been worse over the last few weeks.  She works as a Production assistant, radio at [employer] and says that working and carrying heavy objects or bending over picking things up makes the pain worse.  Rest makes a little bit better.  Occasionally she has some tingling in her fingertips and in her toes but it is inconsistent and nothing in particular seems to make it happen, improve, or worsen.  She has not had seizures recently.  She denies pain in the middle of her back along her spine, only in the soft tissue on both sides but worse on the right.  She has no numbness or tingling and no pain radiating down the back of her legs.  She has had no falls recently and no traumatic injuries.  Past Medical History:  Diagnosis Date  . Seizures [facility])     Patient Active Problem List   Diagnosis Date Noted  . Seizure (HCC) 04/21/2015    History reviewed. No pertinent surgical history.  Prior to Admission medications   Medication Sig Start Date End Date Taking? Authorizing Provider  lidocaine (LIDODERM) 5 % Place 1 patch onto the skin every 12 (twelve) hours. Remove & Discard patch within 12 hours or as directed by MD.  Wynelle Fanny the patch off for 12 hours before applying a new one. 02/02/18 02/02/19  Loleta Rose, MD    Allergies Dilantin [phenytoin] and Tramadol  Family History  Problem Relation Age of Onset  . Seizures Other   . Hypertension Other      Social History Social History   Tobacco Use  . Smoking status: Current Every Day Smoker    Packs/day: 1.00  . Smokeless tobacco: Current User  Substance Use Topics  . Alcohol use: Yes  . Drug use: Yes    Types: Marijuana    Comment: last used this am    Review of Systems Constitutional: No fever/chills Cardiovascular: Denies chest pain. Respiratory: Denies shortness of breath. Gastrointestinal: No abdominal pain.  No nausea, no vomiting.   Musculoskeletal: Bilateral low back pain, no spinal pain/tenderness. Integumentary: Negative for rash. Neurological: Negative for headaches, focal weakness or numbness.   ____________________________________________   PHYSICAL EXAM:  VITAL SIGNS: ED Triage Vitals  Enc Vitals Group     BP 02/01/18 2145 112/69     Pulse Rate 02/01/18 2145 83     Resp 02/01/18 2145 17     Temp 02/01/18 2145 98.6 F (37 C)     Temp Source 02/01/18 2145 Oral     SpO2 02/01/18 2145 99 %     Weight 02/01/18 2145 63.5 kg (140 lb)     Height 02/01/18 2145 1.499 m ( )     Head Circumference --      Peak Flow --      Pain Score 02/01/18 2144 7     Pain Loc --      Pain Edu? --  Excl. in GC? --     Constitutional: Alert and oriented. Well appearing and in no acute distress. Eyes: Conjunctivae are normal.  Head: Atraumatic. Cardiovascular: Normal rate, regular rhythm. Good peripheral circulation.  Respiratory: Normal respiratory effort.  No retractions.  Musculoskeletal: Soft tissue tenderness of the lower lumbar spine worse on the right than the left.  No palpable abnormalities.  No tenderness to palpation of the L-spine.  No erythema, no fluctuance nor induration Neurologic:  Normal speech and language. No gross focal neurologic deficits are appreciated.  Skin:  Skin is warm, dry and intact. No rash noted. Psychiatric: Mood and affect are normal. Speech and behavior are normal.  ____________________________________________    LABS (all labs ordered are listed, but only abnormal results are displayed)  Labs Reviewed - No data to display ____________________________________________  EKG  No indication for EKG ____________________________________________  RADIOLOGY   ED MD interpretation: No indication for imaging  Official radiology report(s): No results found.  ____________________________________________   PROCEDURES  Critical Care performed: No   Procedure(s) performed:   Procedures   ____________________________________________   INITIAL IMPRESSION / ASSESSMENT AND PLAN / ED COURSE  As part of my medical decision making, I reviewed the following data within the electronic MEDICAL RECORD NUMBER Nursing notes reviewed and incorporated    No warning signs of an emergent issue with her low back.  Her HPI is very consistent with musculoskeletal strain as result of her job.  We had my usual customary discussion about NSAIDs, heating pads, back exercises and flex ability, etc.  I encouraged her to take scheduled doses of ibuprofen with meals and I am providing a prescription for Lidoderm patches.  She will follow-up with her regular doctor.  I gave my usual and customary return precautions.      ____________________________________________  FINAL CLINICAL IMPRESSION(S) / ED DIAGNOSES  Final diagnoses:  Strain of lumbar region, initial encounter     MEDICATIONS GIVEN DURING THIS VISIT:  Medications  lidocaine (LIDODERM) 5 % 1 patch (has no administration in time range)     ED Discharge Orders        Ordered    lidocaine (LIDODERM) 5 %  Every 12 hours     02/02/18 0311       Note:  This document was prepared using Dragon voice recognition software and may include unintentional dictation errors.    Loleta Rose, MD 02/02/18 [phone]

## 2018-03-16 ENCOUNTER — Emergency Department
Admission: EM | Admit: 2018-03-16 | Discharge: 2018-03-16 | Disposition: A | Discharge status: Home / Self Care | Payer: Self-pay | Attending: Emergency Medicine | Admitting: Emergency Medicine

## 2018-03-16 ENCOUNTER — Other Ambulatory Visit: Payer: Self-pay

## 2018-03-16 DIAGNOSIS — K047 Periapical abscess without sinus: Secondary | ICD-10-CM

## 2018-03-16 DIAGNOSIS — R220 Localized swelling, mass and lump, head: Secondary | ICD-10-CM | POA: Insufficient documentation

## 2018-03-16 DIAGNOSIS — F17210 Nicotine dependence, cigarettes, uncomplicated: Secondary | ICD-10-CM | POA: Insufficient documentation

## 2018-03-16 DIAGNOSIS — F1210 Cannabis abuse, uncomplicated: Secondary | ICD-10-CM | POA: Insufficient documentation

## 2018-03-16 MED ORDER — AMOXICILLIN-POT CLAVULANATE 875-125 MG PO TABS
1.0000 | ORAL_TABLET | Freq: Once | ORAL | Status: AC
  Administered 2018-03-16: 1 via ORAL
  Filled 2018-03-16: qty 1

## 2018-03-16 MED ORDER — HYDROCODONE-ACETAMINOPHEN 5-325 MG PO TABS
1.0000 | ORAL_TABLET | Freq: Once | ORAL | Status: AC
  Administered 2018-03-16: 1 via ORAL
  Filled 2018-03-16: qty 1

## 2018-03-16 MED ORDER — AMOXICILLIN-POT CLAVULANATE 875-125 MG PO TABS: 1.0000 | ORAL_TABLET | Freq: Two times a day (BID) | ORAL | 0 refills | Status: DC

## 2018-03-16 MED ORDER — PHENYTOIN SODIUM EXTENDED 100 MG PO CAPS
300.00 | ORAL_CAPSULE | ORAL | Status: DC
Start: 2018-03-15 — End: 2018-03-16

## 2018-03-16 NOTE — ED Provider Notes (Signed)
[facility] Emergency Department Provider Note  ____________________________________________  Time seen: Approximately 5:26 PM  I have reviewed the triage vital signs and the nursing notes.   HISTORY  Chief Complaint Dental Pain    HPI [name] is a 22 y.o. female who presents the emergency department complaining of right lower dental pain and swelling.  Patient reports that she has had increasing pain to her right front jaw with mild swelling over the past 2 days.  Patient denies any purulent drainage into the mouth.  No fevers or chills, difficulty breathing or swallowing.  Patient has been taken Tylenol Motrin for same but states that pain and swelling is worsening.  No other complaint at this time.     Past Medical History:  Diagnosis Date  . Seizures [facility](HCC)     Patient Active Problem List   Diagnosis Date Noted  . Seizure (HCC) 04/21/2015    History reviewed. No pertinent surgical history.  Prior to Admission medications   Medication Sig Start Date End Date Taking? Authorizing Provider  amoxicillin-clavulanate (AUGMENTIN) 875-125 MG tablet Take 1 tablet by mouth 2 (two) times daily. 03/16/18   Cuthriell, Delorise RoyalsJonathan D, PA-C  lidocaine (LIDODERM) 5 % Place 1 patch onto the skin every 12 (twelve) hours. Remove & Discard patch within 12 hours or as directed by MD.  Wynelle FannyLeave the patch off for 12 hours before applying a new one. 02/02/18 02/02/19  Loleta RoseForbach, Cory, MD    Allergies Dilantin [phenytoin] and Tramadol  Family History  Problem Relation Age of Onset  . Seizures Other   . Hypertension Other     Social History Social History   Tobacco Use  . Smoking status: Current Every Day Smoker    Packs/day: 1.00  . Smokeless tobacco: Current User  Substance Use Topics  . Alcohol use: Yes  . Drug use: Yes    Types: Marijuana    Comment: last used this am     Review of Systems  Constitutional: No fever/chills Eyes: No visual changes. No  discharge ENT: Positive for right lower jaw/dental pain. Cardiovascular: no chest pain. Respiratory: no cough. No SOB. Gastrointestinal: No abdominal pain.  No nausea, no vomiting.  No diarrhea.  No constipation. Musculoskeletal: Negative for musculoskeletal pain. Skin: Negative for rash, abrasions, lacerations, ecchymosis. Neurological: Negative for headaches, focal weakness or numbness. 10-point ROS otherwise negative.  ____________________________________________   PHYSICAL EXAM:  VITAL SIGNS: ED Triage Vitals [03/16/18 1643]  Enc Vitals Group     BP 112/63     Pulse Rate 86     Resp 17     Temp 98.4 F (36.9 C)     Temp Source Oral     SpO2 98 %     Weight 145 lb (65.8 kg)     Height 4\' 10"  (1.473 m)     Head Circumference      Peak Flow      Pain Score 10     Pain Loc      Pain Edu?      Excl. in GC?      Constitutional: Alert and oriented. Well appearing and in no acute distress. Eyes: Conjunctivae are normal. PERRL. EOMI. Head: Atraumatic. ENT:      Ears:       Nose: No congestion/rhinnorhea.      Mouth/Throat: Mucous membranes are moist.  Patient does have dental caries, dental erosions but no significant missing dentition.  No significant erythema or edema surrounding teeth and area  of complaint.  No fluctuance or induration with depression of tongue depressor in this region.  Palpation along the exterior right submandibular region reveals firm lesion.  No fluctuance or induration on palpation. Neck: No stridor.  Neck is supple full range of motion Hematological/Lymphatic/Immunilogical: No cervical lymphadenopathy. Cardiovascular: Normal rate, regular rhythm. Normal S1 and S2.  Good peripheral circulation. Respiratory: Normal respiratory effort without tachypnea or retractions. Lungs CTAB. Good air entry to the bases with no decreased or absent breath sounds. Musculoskeletal: Full range of motion to all extremities. No gross deformities  appreciated. Neurologic:  Normal speech and language. No gross focal neurologic deficits are appreciated.  Skin:  Skin is warm, dry and intact. No rash noted. Psychiatric: Mood and affect are normal. Speech and behavior are normal. Patient exhibits appropriate insight and judgement.   ____________________________________________   LABS (all labs ordered are listed, but only abnormal results are displayed)  Labs Reviewed - No data to display ____________________________________________  EKG   ____________________________________________  RADIOLOGY   No results found.  ____________________________________________    PROCEDURES  Procedure(s) performed:    Procedures    Medications  amoxicillin-clavulanate (AUGMENTIN) 875-125 MG per tablet 1 tablet (has no administration in time range)  HYDROcodone-acetaminophen (NORCO/VICODIN) 5-325 MG per tablet 1 tablet (has no administration in time range)     ____________________________________________   INITIAL IMPRESSION / ASSESSMENT AND PLAN / ED COURSE  Pertinent labs & imaging results that were available during my care of the patient were reviewed by me and considered in my medical decision making (see chart for details).  Review of the [place] CSRS was performed in accordance of the [facility] prior to dispensing any controlled drugs.      Patient's diagnosis is consistent with dental infection.  Patient presents emergency department right dental pain and swelling.  On exam, patient does have a firm lesion to the right submandibular region.  Difficulty ascertaining whether this is sialoadenitis versus apical abscess.  No indication for labs or imaging at this time.  Patient is given first dose of antibiotics in the emergency department.. Patient will be discharged home with prescriptions for Augmentin.  To facilitate improvement, patient is also advised to use sour candy if this is sialoadenitis.. Patient is to follow up with  dentist and/or primary care as needed or otherwise directed. Patient is given ED precautions to return to the ED for any worsening or new symptoms.     ____________________________________________  FINAL CLINICAL IMPRESSION(S) / ED DIAGNOSES  Final diagnoses:  Dental infection      NEW MEDICATIONS STARTED DURING THIS VISIT:  ED Discharge Orders        Ordered    amoxicillin-clavulanate (AUGMENTIN) 875-125 MG tablet  2 times daily     03/16/18 1741          This chart was dictated using voice recognition software/Dragon. Despite best efforts to proofread, errors can occur which can change the meaning. Any change was purely unintentional.    Racheal Patches, PA-C 03/16/18 1741    Phineas Semen, MD 03/16/18 2021

## 2018-03-16 NOTE — ED Triage Notes (Signed)
Pt c/o front lower tooth pain with mild swelling. For the past 2 days.

## 2018-08-20 ENCOUNTER — Other Ambulatory Visit: Payer: Self-pay

## 2018-08-20 ENCOUNTER — Ambulatory Visit
Admission: EM | Admit: 2018-08-20 | Discharge: 2018-08-20 | Disposition: A | Discharge status: Home / Self Care | Payer: Self-pay | Attending: Family Medicine | Admitting: Family Medicine

## 2018-08-20 DIAGNOSIS — M545 Low back pain, unspecified: Secondary | ICD-10-CM

## 2018-08-20 DIAGNOSIS — R300 Dysuria: Secondary | ICD-10-CM | POA: Insufficient documentation

## 2018-08-20 DIAGNOSIS — B9689 Other specified bacterial agents as the cause of diseases classified elsewhere: Secondary | ICD-10-CM | POA: Insufficient documentation

## 2018-08-20 DIAGNOSIS — N760 Acute vaginitis: Secondary | ICD-10-CM | POA: Insufficient documentation

## 2018-08-20 LAB — URINALYSIS, COMPLETE (UACMP) WITH MICROSCOPIC
BILIRUBIN URINE: NEGATIVE
GLUCOSE, UA: NEGATIVE mg/dL
Ketones, ur: NEGATIVE mg/dL
PH: 6 (ref 5.0–8.0)
Specific Gravity, Urine: 1.01 (ref 1.005–1.030)

## 2018-08-20 LAB — WET PREP, GENITAL
Sperm: NONE SEEN
Trich, Wet Prep: NONE SEEN
Yeast Wet Prep HPF POC: NONE SEEN

## 2018-08-20 MED ORDER — SULFAMETHOXAZOLE-TRIMETHOPRIM 800-160 MG PO TABS: 1.0000 | ORAL_TABLET | Freq: Two times a day (BID) | ORAL | 0 refills | Status: AC

## 2018-08-20 MED ORDER — METRONIDAZOLE 500 MG PO TABS: 500.0000 mg | ORAL_TABLET | Freq: Two times a day (BID) | ORAL | 0 refills | Status: DC

## 2018-08-20 NOTE — Discharge Instructions (Signed)
Take medication as prescribed. Rest. Drink plenty of fluids. Pelvic rest. Monitor.   Follow up with your primary care physician this week as needed. Return to Urgent care for new or worsening concerns.

## 2018-08-20 NOTE — ED Triage Notes (Signed)
Patient complains of urinary frequency, urgency, back pain that started Saturday. States that she took some AZO--last dose last night.

## 2018-08-20 NOTE — ED Provider Notes (Addendum)
[facility] ____________________________________________  Time seen: Approximately 2:14 PM  I have reviewed the triage vital signs and the nursing notes.   HISTORY  Chief Complaint Flank Pain  HPI [name] is a 22 y.o. female presenting for evaluation of 4 days of urinary frequency, urinary urgency, slight discomfort with urination.  States symptoms present since Saturday.  Did take Azo for 2 days which slightly helped but no resolution.  Reports also having bilateral low back pain, but states right low back pain is worse than left.  States that she does often have low back pain anyways with some similar presentation, but states the last few days it has increased.  States she has occasionally felt the pain wraparound to right side, but not persistently and denies currently.  States the pain when it wraps around is very minimal and usually associated with movement.  States low back pain bilaterally is an aching pain mild to moderate currently and again worse with activity.  States also has had some whitish vaginal discharge, without odor, without sores, nonodorous and not painful.  Denies concerns of STDs.  Denies chance of pregnancy.  Continues to eat and drink well.  Reports otherwise feels fine.  Denies chest pain or shortness of breath, anterior abdominal pain, fevers.  Denies recent sickness.  Denies recent antibiotic use.  States seizures have been well controlled.  Patient's last menstrual period was 07/25/2018.denies pregnancy.   Past Medical History:  Diagnosis Date  . Seizures [facility])     Patient Active Problem List   Diagnosis Date Noted  . Seizure (HCC) 04/21/2015    Past Surgical History:  Procedure Laterality Date  . NO PAST SURGERIES       No current facility-administered medications for this encounter.   Current Outpatient Medications:  .  lamoTRIgine (LAMICTAL) 150 MG tablet, Take by mouth., Disp: , Rfl:  .  metroNIDAZOLE (FLAGYL) 500 MG tablet,  Take 1 tablet (500 mg total) by mouth 2 (two) times daily., Disp: 14 tablet, Rfl: 0 .  sulfamethoxazole-trimethoprim (BACTRIM DS,SEPTRA DS) 800-160 MG tablet, Take 1 tablet by mouth 2 (two) times daily for 3 days., Disp: 6 tablet, Rfl: 0  Allergies Dilantin [phenytoin] and Tramadol  Family History  Problem Relation Age of Onset  . Seizures Other   . Hypertension Other   . Diabetes Mother   . Diabetes Father     Social History Social History   Tobacco Use  . Smoking status: Former Smoker    Packs/day: 1.00    Last attempt to quit: 08/06/2018    Years since quitting: 0.0  . Smokeless tobacco: Current User  Substance Use Topics  . Alcohol use: Not Currently  . Drug use: Yes    Types: Marijuana    Comment: last used this am    Review of Systems Constitutional: No fever Cardiovascular: Denies chest pain. Respiratory: Denies shortness of breath. Gastrointestinal: No abdominal pain.  No nausea, no vomiting.  No diarrhea.  Genitourinary: positive for dysuria. Musculoskeletal: positive for back pain. Skin: Negative for rash.   ____________________________________________   PHYSICAL EXAM:  VITAL SIGNS: ED Triage Vitals  Enc Vitals Group     BP 08/20/18 1323 104/71     Pulse Rate 08/20/18 1323 91     Resp 08/20/18 1323 17     Temp 08/20/18 1323 97.9 F (36.6 C)     Temp Source 08/20/18 1323 Oral     SpO2 08/20/18 1323 99 %     Weight 08/20/18  1320 145 lb (65.8 kg)     Height 08/20/18 1320 4\' 10"  (1.473 m)     Head Circumference --      Peak Flow --      Pain Score 08/20/18 1320 8     Pain Loc --      Pain Edu? --      Excl. in GC? --     Constitutional: Alert and oriented. Well appearing and in no acute distress. ENT      Head: Normocephalic and atraumatic. Cardiovascular: Normal rate, regular rhythm. Grossly normal heart sounds.  Good peripheral circulation. Respiratory: Normal respiratory effort without tachypnea nor retractions. Breath sounds are clear and  equal bilaterally. No wheezes, rales, rhonchi. Gastrointestinal: No distention. Normal Bowel sounds. No CVA tenderness.  Minimal diffuse lower abdominal tenderness palpation bilaterally and intermittent on exam, non-guarding, abdomen otherwise soft and nontender. Musculoskeletal:  No midline cervical, thoracic tenderness to palpation.  Mild lower lumbar and diffuse bilateral paralumbar tenderness to palpation.  Pain increases with flexion as well as overhead reaching, full range of motion present. Neurologic:  Normal speech and language. No gross focal neurologic deficits are appreciated. Speech is normal. No gait instability.  Skin:  Skin is warm, dry and intact. No rash noted. Psychiatric: Mood and affect are normal. Speech and behavior are normal. Patient exhibits appropriate insight and judgment   ___________________________________________   LABS (all labs ordered are listed, but only abnormal results are displayed)  Labs Reviewed  WET PREP, GENITAL - Abnormal; Notable for the following components:      Result Value   Clue Cells Wet Prep HPF POC PRESENT (*)    WBC, Wet Prep HPF POC FEW (*)    All other components within normal limits  URINALYSIS, COMPLETE (UACMP) WITH MICROSCOPIC - Abnormal; Notable for the following components:   Color, Urine BIOCHEMICALS MAY BE AFFECTED BY COLOR (*)    APPearance HAZY (*)    Hgb urine dipstick SMALL (*)    Protein, ur TRACE (*)    Nitrite   (*)    Value: TEST NOT REPORTED DUE TO COLOR INTERFERENCE OF URINE PIGMENT   Leukocytes, UA TRACE (*)    Bacteria, UA RARE (*)    All other components within normal limits     PROCEDURES Procedures    INITIAL IMPRESSION / ASSESSMENT AND PLAN / ED COURSE  Pertinent labs & imaging results that were available during my care of the patient were reviewed by me and considered in my medical decision making (see chart for details).  Very well-appearing patient.  No acute distress.  Discussed multiple  differentials with patient including UTI, bacterial vaginosis, pyelonephritis, nephrolithiasis and muscle strain.  Will evaluate wet prep, elected self wet prep.  Patient declines urine culture due to lack of insurance.Concern for UTI, positive bacterial vaginosis And muscle strain.  Will treat with Bactrim, Flagyl, and discussed over-the-counter supportive care ibuprofen, Tylenol, stretching.  Discussed very strict follow-up and return parameters. Discussed indication, risks and benefits of medications with patient.  Discussed follow up with Primary care physician this week as needed. Discussed follow up and return parameters including no resolution or any worsening concerns. Patient verbalized understanding and agreed to plan.   ____________________________________________   FINAL CLINICAL IMPRESSION(S) / ED DIAGNOSES  Final diagnoses:  Dysuria  Bacterial vaginosis  Bilateral low back pain without sciatica, unspecified chronicity     ED Discharge Orders         Ordered    sulfamethoxazole-trimethoprim (BACTRIM DS,SEPTRA  DS) 800-160 MG tablet  2 times daily     08/20/18 1456    metroNIDAZOLE (FLAGYL) 500 MG tablet  2 times daily     08/20/18 1456           Note: This dictation was prepared with Dragon dictation along with smaller phrase technology. Any transcriptional errors that result from this process are unintentional.         Renford Dills, NP 08/20/18 1510

## 2021-05-16 ENCOUNTER — Encounter (HOSPITAL_COMMUNITY): Payer: Self-pay | Admitting: *Deleted

## 2021-05-16 ENCOUNTER — Other Ambulatory Visit: Payer: Self-pay

## 2021-05-16 ENCOUNTER — Emergency Department (HOSPITAL_COMMUNITY)
Admission: EM | Admit: 2021-05-16 | Discharge: 2021-05-16 | Disposition: A | Discharge status: Home / Self Care | Payer: Self-pay | Attending: Emergency Medicine | Admitting: Emergency Medicine

## 2021-05-16 DIAGNOSIS — Z87891 Personal history of nicotine dependence: Secondary | ICD-10-CM | POA: Insufficient documentation

## 2021-05-16 DIAGNOSIS — L03116 Cellulitis of left lower limb: Secondary | ICD-10-CM | POA: Insufficient documentation

## 2021-05-16 MED ORDER — DOXYCYCLINE HYCLATE 100 MG PO CAPS: 100.0000 mg | ORAL_CAPSULE | Freq: Two times a day (BID) | ORAL | 0 refills | Status: DC

## 2021-05-16 NOTE — ED Triage Notes (Signed)
Left leg swollen possible spider bite

## 2021-05-16 NOTE — ED Provider Notes (Signed)
[facility] EMERGENCY DEPARTMENT Provider Note  CSN: [id number] Arrival date & time: 05/16/21 1347    History Chief Complaint  Patient presents with   Insect Bite    [name] is a 25 y.o. female with no significant PMH reports she noticed a spot on her L leg 2 days ago that has been draining some pus and has developed some redness and tenderness that has eben expanding since last night. No fever, unsure if she may have been bitten by something.    Past Medical History:  Diagnosis Date   Seizures (HCC)     Past Surgical History:  Procedure Laterality Date   NO PAST SURGERIES      Family History  Problem Relation Age of Onset   Seizures Other    Hypertension Other    Diabetes Mother    Diabetes Father     Social History   Tobacco Use   Smoking status: Former    Packs/day: 1.00    Types: Cigarettes    Quit date: 08/06/2018    Years since quitting: 2.7   Smokeless tobacco: Current  Vaping Use   Vaping Use: Every day  Substance Use Topics   Alcohol use: Not Currently   Drug use: Yes    Types: Marijuana    Comment: last used this am     Home Medications Prior to Admission medications   Medication Sig Start Date End Date Taking? Authorizing Provider  doxycycline (VIBRAMYCIN) 100 MG capsule Take 1 capsule (100 mg total) by mouth 2 (two) times daily. 05/16/21  Yes Pollyann Savoy, MD     Allergies    Dilantin [phenytoin] and Tramadol   Review of Systems   Review of Systems A comprehensive review of systems was completed and negative except as noted in HPI.    Physical Exam BP 97/71   Pulse 95   Temp 98.3 F (36.8 C)   Resp 20   LMP 04/29/2021 (Exact Date)   SpO2 100%   Physical Exam Vitals and nursing note reviewed.  HENT:     Head: Normocephalic.     Nose: Nose normal.  Eyes:     Extraocular Movements: Extraocular movements intact.  Pulmonary:     Effort: Pulmonary effort is normal.  Musculoskeletal:        General: Normal range  of motion.     Cervical back: Neck supple.  Skin:    Findings: Rash (on exposed skin) present.     Comments: Small pustule on L medial thigh with surrounding erythema, induration and warmth, pustule was deroofed without drainage of pus and no focal fluctuance  Neurological:     Mental Status: She is alert and oriented to person, place, and time.  Psychiatric:        Mood and Affect: Mood normal.     ED Results / Procedures / Treatments   Labs (all labs ordered are listed, but only abnormal results are displayed) Labs Reviewed - No data to display  EKG None  Radiology No results found.  Procedures Procedures  Medications Ordered in the ED Medications - No data to display   MDM Rules/Calculators/A&P MDM   ED Course  I have reviewed the triage vital signs and the nursing notes.  Pertinent labs & imaging results that were available during my care of the patient were reviewed by me and considered in my medical decision making (see chart for details).     Final Clinical Impression(s) / ED Diagnoses Final diagnoses:  Cellulitis of left lower extremity    Rx / DC Orders ED Discharge Orders          Ordered    doxycycline (VIBRAMYCIN) 100 MG capsule  2 times daily        05/16/21 1509             Pollyann Savoy, MD 05/16/21 [phone]

## 2021-08-04 ENCOUNTER — Encounter: Payer: Self-pay | Admitting: Emergency Medicine

## 2021-08-04 ENCOUNTER — Other Ambulatory Visit: Payer: Self-pay

## 2021-08-04 ENCOUNTER — Emergency Department
Admission: EM | Admit: 2021-08-04 | Discharge: 2021-08-04 | Disposition: A | Discharge status: Home / Self Care | Payer: Self-pay | Attending: Emergency Medicine | Admitting: Emergency Medicine

## 2021-08-04 DIAGNOSIS — Z87891 Personal history of nicotine dependence: Secondary | ICD-10-CM | POA: Insufficient documentation

## 2021-08-04 DIAGNOSIS — U071 COVID-19: Secondary | ICD-10-CM | POA: Insufficient documentation

## 2021-08-04 LAB — RESP PANEL BY RT-PCR (FLU A&B, COVID) ARPGX2
Influenza A by PCR: NEGATIVE
Influenza B by PCR: NEGATIVE
SARS Coronavirus 2 by RT PCR: POSITIVE — AB

## 2021-08-04 NOTE — ED Provider Notes (Signed)
[facility] Emergency Department Provider Note   ____________________________________________    I have reviewed the triage vital signs and the nursing notes.   HISTORY  Chief Complaint Generalized Body Aches     HPI [name] is a 25 y.o. female who reports she has had body aches, fatigue and has taken several COVID test at home which were positive.  She reports she needs a confirmatory PCR test for work.  She denies shortness of breath.  No chest pain.  Has been taking over-the-counter medication appropriately.  Past Medical History:  Diagnosis Date   Seizures [facility])     Patient Active Problem List   Diagnosis Date Noted   Seizure (HCC) 04/21/2015    Past Surgical History:  Procedure Laterality Date   NO PAST SURGERIES      Prior to Admission medications   Medication Sig Start Date End Date Taking? Authorizing Provider  doxycycline (VIBRAMYCIN) 100 MG capsule Take 1 capsule (100 mg total) by mouth 2 (two) times daily. 05/16/21   Pollyann Savoy, MD     Allergies Dilantin [phenytoin] and Tramadol  Family History  Problem Relation Age of Onset   Seizures Other    Hypertension Other    Diabetes Mother    Diabetes Father     Social History Social History   Tobacco Use   Smoking status: Former    Packs/day: 1.00    Types: Cigarettes    Quit date: 08/06/2018    Years since quitting: 2.9   Smokeless tobacco: Current  Vaping Use   Vaping Use: Every day  Substance Use Topics   Alcohol use: Not Currently   Drug use: Yes    Types: Marijuana    Comment: last used this am    Review of Systems  Constitutional: As above  ENT: Positive congestion   Gastrointestinal: No abdominal pain.  No nausea, no vomiting.    Musculoskeletal: Myalgia Skin: Negative for rash. Neurological: Negative for headaches     ____________________________________________   PHYSICAL EXAM:  VITAL SIGNS: ED Triage Vitals  Enc Vitals  Group     BP 08/04/21 1228 112/86     Pulse Rate 08/04/21 1228 76     Resp 08/04/21 1228 20     Temp 08/04/21 1228 98.2 F (36.8 C)     Temp Source 08/04/21 1228 Oral     SpO2 08/04/21 1228 96 %     Weight 08/04/21 1227 72.6 kg (160 lb)     Height 08/04/21 1227 1.499 m (4\' 11" )     Head Circumference --      Peak Flow --      Pain Score 08/04/21 1227 5     Pain Loc --      Pain Edu? --      Excl. in GC? --      Constitutional: Alert and oriented. No acute distress. Pleasant and interactive Eyes: Conjunctivae are normal.  Head: Atraumatic. Nose: No congestion/rhinnorhea. Mouth/Throat: Mucous membranes are moist.   Cardiovascular: Normal rate, regular rhythm.  Respiratory: Normal respiratory effort.  No retractions. Genitourinary: deferred Musculoskeletal: No lower extremity tenderness nor edema.   Neurologic:  Normal speech and language. No gross focal neurologic deficits are appreciated.   Skin:  Skin is warm, dry and intact. No rash noted.   ____________________________________________   LABS (all labs ordered are listed, but only abnormal results are displayed)  Labs Reviewed  RESP PANEL BY RT-PCR (FLU A&B, COVID) ARPGX2 - Abnormal;  Notable for the following components:      Result Value   SARS Coronavirus 2 by RT PCR POSITIVE (*)    All other components within normal limits   ____________________________________________  EKG   ____________________________________________  RADIOLOGY   ____________________________________________   PROCEDURES  Procedure(s) performed: No  Procedures   Critical Care performed: No ____________________________________________   INITIAL IMPRESSION / ASSESSMENT AND PLAN / ED COURSE  Pertinent labs & imaging results that were available during my care of the patient were reviewed by me and considered in my medical decision making (see chart for details).   Patient reports home COVID test positive, symptoms of upper  respiratory infection.  We will send PCR for her work purposes  COVID test is positive   ____________________________________________   FINAL CLINICAL IMPRESSION(S) / ED DIAGNOSES  Final diagnoses:  COVID      NEW MEDICATIONS STARTED DURING THIS VISIT:  Discharge Medication List as of 08/04/2021 12:50 PM       Note:  This document was prepared using Dragon voice recognition software and may include unintentional dictation errors.    Jene Every, MD 08/04/21 [phone]

## 2021-08-04 NOTE — ED Triage Notes (Signed)
Pt reports took 3 covid tests at home and they were all positive and she needs to let her job know how long she needs to be out. Pt does reports some bodyaches

## 2021-09-13 ENCOUNTER — Other Ambulatory Visit: Payer: Self-pay

## 2021-09-13 ENCOUNTER — Emergency Department
Admission: EM | Admit: 2021-09-13 | Discharge: 2021-09-13 | Disposition: A | Discharge status: Home / Self Care | Payer: Self-pay | Attending: Emergency Medicine | Admitting: Emergency Medicine

## 2021-09-13 ENCOUNTER — Encounter: Payer: Self-pay | Admitting: Emergency Medicine

## 2021-09-13 DIAGNOSIS — B349 Viral infection, unspecified: Secondary | ICD-10-CM

## 2021-09-13 DIAGNOSIS — Z20822 Contact with and (suspected) exposure to covid-19: Secondary | ICD-10-CM | POA: Insufficient documentation

## 2021-09-13 LAB — RESP PANEL BY RT-PCR (FLU A&B, COVID) ARPGX2
Influenza A by PCR: NEGATIVE
Influenza B by PCR: NEGATIVE
SARS Coronavirus 2 by RT PCR: NEGATIVE

## 2021-09-13 LAB — POC URINE PREG, ED: Preg Test, Ur: NEGATIVE

## 2021-09-13 MED ORDER — ONDANSETRON 4 MG PO TBDP: 4.0000 mg | ORAL_TABLET | Freq: Three times a day (TID) | ORAL | 0 refills | Status: DC | PRN

## 2021-09-13 MED ORDER — ONDANSETRON 4 MG PO TBDP
4.0000 mg | ORAL_TABLET | Freq: Once | ORAL | Status: AC
Start: 2021-09-13 — End: 2021-09-13
  Administered 2021-09-13: 4 mg via ORAL
  Filled 2021-09-13: qty 1

## 2021-09-13 NOTE — ED Provider Triage Note (Signed)
Emergency Medicine Provider Triage Evaluation Note  [name] , a 26 y.o. female  was evaluated in triage.  Pt complains of emesis since this morning. She would like to ensure that she is not pregnant or has the flu. .  Review of Systems  Positive: Vomiting Negative: dysuria  Physical Exam  Ht 4\' 11"  (1.499 m)    Wt 72.6 kg    BMI 32.33 kg/m  Gen:   Awake, no distress   Resp:  Normal effort  MSK:   Moves extremities without difficulty  Other:    Medical Decision Making  Medically screening exam initiated at 11:56 AM.  Appropriate orders placed.  [name] was informed that the remainder of the evaluation will be completed by another provider, this initial triage assessment does not replace that evaluation, and the importance of remaining in the ED until their evaluation is complete.    Jennefer Bravo, FNP 09/13/21 [phone]

## 2021-09-13 NOTE — Discharge Instructions (Signed)
Follow-up with your primary care provider or urgent care if any continued problems.  A prescription for Zofran was sent to your pharmacy to take every 8 hours as needed for nausea.  Drink clear liquids frequently to stay hydrated.  If you develop a fever take Tylenol or ibuprofen.  If any worsening of your symptoms you may return to the emergency department for further evaluation.

## 2021-09-13 NOTE — ED Provider Notes (Signed)
[facility] Provider Note    Event Date/Time   First MD Initiated Contact with Patient 09/13/21 1411     (approximate)   History   Emesis   HPI  [name] is a 26 y.o. female presents to the ED with complaint of vomiting this morning that was frequent.  She denies any fever, chills, cough or diarrhea.  No history of urinary frequency or dysuria.  Patient is here to rule out either flu or pregnancy.  Patient has positive history of seizures and also a former smoker.  She denies any pain at this time.      Physical Exam   Triage Vital Signs: ED Triage Vitals  Enc Vitals Group     BP 09/13/21 1157 96/76     Pulse Rate 09/13/21 1157 72     Resp 09/13/21 1157 17     Temp 09/13/21 1157 97.8 F (36.6 C)     Temp Source 09/13/21 1157 Oral     SpO2 09/13/21 1157 98 %     Weight 09/13/21 1156 160 lb 0.9 oz (72.6 kg)     Height 09/13/21 1156 4\' 11"  (1.499 m)     Head Circumference --      Peak Flow --      Pain Score 09/13/21 1155 0     Pain Loc --      Pain Edu? --      Excl. in [place]? --     Most recent vital signs: Vitals:   09/13/21 1157  BP: 96/76  Pulse: 72  Resp: 17  Temp: 97.8 F (36.6 C)  SpO2: 98%     General: Awake, no distress.  No active vomiting noted in exam room. CV:  Good peripheral perfusion.  Heart regular rate and rhythm without murmur. Resp:  Normal effort.  Lungs are clear bilaterally to auscultation. Abd:  No distention.  Soft, nontender, bowel sounds normoactive x4 quadrants. Other:  Patient is able to move upper and lower extremities without any difficulty.   ED Results / Procedures / Treatments   Labs (all labs ordered are listed, but only abnormal results are displayed) Labs Reviewed  RESP PANEL BY RT-PCR (FLU A&B, COVID) ARPGX2  POC URINE PREG, ED     PROCEDURES:  Critical Care performed: No  Procedures   MEDICATIONS ORDERED IN ED: Medications  ondansetron (ZOFRAN-ODT) disintegrating tablet 4  mg (has no administration in time range)     IMPRESSION / MDM / ASSESSMENT AND PLAN / ED COURSE  I reviewed the triage vital signs and the nursing notes.                              Differential diagnosis includes, but is not limited to, influenza, COVID, and pregnancy causing emesis.  26 year old female presents to the ED with history of frequent emesis this morning causing her to think that she either has influenza or that she is pregnant.  Patient denies any recent vomiting and is feeling better.  Pregnancy, influenza and COVID test were negative and patient was made aware.  She was given Zofran while in the ED.  A prescription for the same was sent to her pharmacy.  She is encouraged to drink plenty of fluids to stay hydrated.  She is encouraged to follow-up with her PCP or urgent care if any continued problems.  She also is aware that she should return to the emergency department  if any severe worsening of her symptoms.     FINAL CLINICAL IMPRESSION(S) / ED DIAGNOSES   Final diagnoses:  Viral illness     Rx / DC Orders   ED Discharge Orders          Ordered    ondansetron (ZOFRAN-ODT) 4 MG disintegrating tablet  Every 8 hours PRN        09/13/21 1429             Note:  This document was prepared using Dragon voice recognition software and may include unintentional dictation errors.   Johnn Hai, PA-C 09/13/21 1438    Lavonia Drafts, MD 09/13/21 1440

## 2021-09-13 NOTE — ED Triage Notes (Signed)
Pt comes into the ED via POV c/o emesis that started this morning.  Pt here to r/o flu or pregnancy.  Pt in NAD. Denies any other complaints.

## 2021-09-21 ENCOUNTER — Ambulatory Visit: Payer: Self-pay

## 2022-04-28 ENCOUNTER — Emergency Department: Payer: Self-pay

## 2022-04-28 ENCOUNTER — Emergency Department
Admission: EM | Admit: 2022-04-28 | Discharge: 2022-04-28 | Disposition: A | Discharge status: Home / Self Care | Payer: Self-pay | Attending: Emergency Medicine | Admitting: Emergency Medicine

## 2022-04-28 DIAGNOSIS — O26899 Other specified pregnancy related conditions, unspecified trimester: Secondary | ICD-10-CM | POA: Insufficient documentation

## 2022-04-28 DIAGNOSIS — O99891 Other specified diseases and conditions complicating pregnancy: Secondary | ICD-10-CM

## 2022-04-28 DIAGNOSIS — R102 Pelvic and perineal pain: Secondary | ICD-10-CM

## 2022-04-28 DIAGNOSIS — O26891 Other specified pregnancy related conditions, first trimester: Secondary | ICD-10-CM

## 2022-04-28 DIAGNOSIS — R8271 Bacteriuria: Secondary | ICD-10-CM

## 2022-04-28 DIAGNOSIS — O2390 Unspecified genitourinary tract infection in pregnancy, unspecified trimester: Secondary | ICD-10-CM | POA: Insufficient documentation

## 2022-04-28 DIAGNOSIS — Z3A01 Less than 8 weeks gestation of pregnancy: Secondary | ICD-10-CM | POA: Insufficient documentation

## 2022-04-28 LAB — COMPREHENSIVE METABOLIC PANEL
ALT: 14 U/L (ref 0–44)
AST: 19 U/L (ref 15–41)
Albumin: 4.3 g/dL (ref 3.5–5.0)
Alkaline Phosphatase: 43 U/L (ref 38–126)
Anion gap: 8 (ref 5–15)
BUN: 9 mg/dL (ref 6–20)
CO2: 22 mmol/L (ref 22–32)
Calcium: 9.7 mg/dL (ref 8.9–10.3)
Chloride: 106 mmol/L (ref 98–111)
Creatinine, Ser: 0.6 mg/dL (ref 0.44–1.00)
GFR, Estimated: >60 mL/min (ref >60)
Glucose, Bld: 97 mg/dL (ref 70–99)
Potassium: 3.5 mmol/L (ref 3.5–5.1)
Sodium: 136 mmol/L (ref 135–145)
Total Bilirubin: 0.7 mg/dL (ref 0.3–1.2)
Total Protein: 7.2 g/dL (ref 6.5–8.1)

## 2022-04-28 LAB — URINALYSIS, ROUTINE W REFLEX MICROSCOPIC
Bilirubin Urine: NEGATIVE
Glucose, UA: NEGATIVE mg/dL
Ketones, ur: NEGATIVE mg/dL
Nitrite: NEGATIVE
Protein, ur: NEGATIVE mg/dL
Specific Gravity, Urine: 1.014 (ref 1.005–1.030)
pH: 5 (ref 5.0–8.0)

## 2022-04-28 LAB — CBC
HCT: 37 % (ref 36.0–46.0)
Hemoglobin: 12.3 g/dL (ref 12.0–15.0)
MCH: 31 pg (ref 26.0–34.0)
MCHC: 33.2 g/dL (ref 30.0–36.0)
MCV: 93.2 fL (ref 80.0–100.0)
Platelets: 298 10*3/uL (ref 150–400)
RBC: 3.97 MIL/uL (ref 3.87–5.11)
RDW: 12.3 % (ref 11.5–15.5)
WBC: 9.7 10*3/uL (ref 4.0–10.5)
nRBC: 0 % (ref 0.0–0.2)

## 2022-04-28 LAB — HCG, QUANTITATIVE, PREGNANCY: hCG, Beta Chain, Quant, S: 36086 m[IU]/mL — ABNORMAL HIGH (ref <5)

## 2022-04-28 LAB — ABO/RH

## 2022-04-28 LAB — POC URINE PREG, ED: Preg Test, Ur: POSITIVE — AB

## 2022-04-28 LAB — LIPASE, BLOOD: Lipase: 37 U/L (ref 11–51)

## 2022-04-28 MED ORDER — ACETAMINOPHEN 325 MG PO TABS
650.0000 mg | ORAL_TABLET | Freq: Once | ORAL | Status: AC
  Administered 2022-04-28: 650 mg via ORAL
  Filled 2022-04-28: qty 2

## 2022-04-28 MED ORDER — CEPHALEXIN 500 MG PO CAPS: 500.0000 mg | ORAL_CAPSULE | Freq: Four times a day (QID) | ORAL | 0 refills | Status: AC

## 2022-04-28 NOTE — Discharge Instructions (Addendum)
Your ultrasound and tests are reassuring today.  You were started on antibiotic given that there was some bacteria in your urine.  Please follow-up with your OB/GYN as soon as possible.  Please return for any new, worsening, or change in symptoms or other concerns.  It was a pleasure caring for you today.

## 2022-04-28 NOTE — ED Triage Notes (Signed)
Pt comes from home by POV c/o abdominal cramping that started today. Pt states usually having cramps but it has gotten very uncomfortable. Pt reports being [redacted] weeks pregnant, this is her second pregnancy. Pt reports being nauseous

## 2022-04-28 NOTE — ED Provider Notes (Signed)
[facility] Provider Note    Event Date/Time   First MD Initiated Contact with Patient 04/28/22 1942     (approximate)   History   Abdominal Cramping   HPI  [name] is a 26 y.o. female G2, P1 currently estimated [redacted] weeks pregnant by LMP who presents today for evaluation of abdominal cramping.  Patient reports that she has had cramping across her low abdomen for the past couple of days.  She has not had any vaginal bleeding.  She has not had any vaginal discharge.  She denies   Patient Active Problem List   Diagnosis Date Noted   Seizure (HCC) 04/21/2015          Physical Exam   Triage Vital Signs: ED Triage Vitals  Enc Vitals Group     BP 04/28/22 1922 112/69     Pulse Rate 04/28/22 1922 78     Resp 04/28/22 1922 16     Temp 04/28/22 1922 98.2 F (36.8 C)     Temp Source 04/28/22 1922 Oral     SpO2 04/28/22 1922 99 %     Weight 04/28/22 1922 153 lb (69.4 kg)     Height 04/28/22 1922 4\' 11"  (1.499 m)     Head Circumference --      Peak Flow --      Pain Score 04/28/22 1926 5     Pain Loc --      Pain Edu? --      Excl. in GC? --     Most recent vital signs: Vitals:   04/28/22 1922 04/28/22 1925  BP: 112/69   Pulse: 78   Resp: 16   Temp: 98.2 F (36.8 C)   SpO2: 99% 99%    Physical Exam Vitals and nursing note reviewed.  Constitutional:      General: Awake and alert. No acute distress.    Appearance: Normal appearance. The patient is normal weight.  HENT:     Head: Normocephalic and atraumatic.     Mouth: Mucous membranes are moist.  Eyes:     General: PERRL. Normal EOMs        Right eye: No discharge.        Left eye: No discharge.     Conjunctiva/sclera: Conjunctivae normal.  Cardiovascular:     Rate and Rhythm: Normal rate and regular rhythm.     Pulses: Normal pulses.     Heart sounds: Normal heart sounds Pulmonary:     Effort: Pulmonary effort is normal. No respiratory distress.     Breath sounds: Normal  breath sounds.  Abdominal:     Abdomen is soft. There is no abdominal tenderness. No rebound or guarding. No distention.  No unilateral tenderness. Musculoskeletal:        General: No swelling. Normal range of motion.     Cervical back: Normal range of motion and neck supple.  Skin:    General: Skin is warm and dry.     Capillary Refill: Capillary refill takes less than 2 seconds.     Findings: No rash.  Neurological:     Mental Status: The patient is awake and alert.      ED Results / Procedures / Treatments   Labs (all labs ordered are listed, but only abnormal results are displayed) Labs Reviewed  URINALYSIS, ROUTINE W REFLEX MICROSCOPIC - Abnormal; Notable for the following components:      Result Value   Color, Urine YELLOW (*)  APPearance HAZY (*)    Hgb urine dipstick SMALL (*)    Leukocytes,Ua MODERATE (*)    Bacteria, UA RARE (*)    All other components within normal limits  HCG, QUANTITATIVE, PREGNANCY - Abnormal; Notable for the following components:   hCG, Beta Chain, Quant, S 36,086 (*)    All other components within normal limits  POC URINE PREG, ED - Abnormal; Notable for the following components:   Preg Test, Ur POSITIVE (*)    All other components within normal limits  URINE CULTURE  LIPASE, BLOOD  COMPREHENSIVE METABOLIC PANEL  CBC  ABO/RH  ABO/RH     EKG     RADIOLOGY     PROCEDURES:  Critical Care performed:   Procedures   MEDICATIONS ORDERED IN ED: Medications  acetaminophen (TYLENOL) tablet 650 mg (650 mg Oral Given 04/28/22 2044)     IMPRESSION / MDM / ASSESSMENT AND PLAN / ED COURSE  I reviewed the triage vital signs and the nursing notes.   Differential diagnosis includes, but is not limited to, ectopic pregnancy, subchorionic hemorrhage, uterine stretching, round ligament pain, urinary tract infection.  Patient is awake and alert, hemodynamically stable and afebrile.  Her hCG Sharene Butters is as expected for her gestational  period.  Her ultrasound demonstrates a single live IUP.  There are no adnexal findings.  No lower quadrant tenderness to suggest appendicitis.  No flank pain to suggest kidney stone or pyelonephritis.  She denies vaginal discharge or concern for STDs.  Patient has no unilateral tenderness.  She is not bleeding.  She was found to have bacteriuria, and therefore was started on Keflex.  She was given Tylenol with complete resolution of her pain.  We discussed return precautions and the importance of close outpatient follow-up.  She has a follow-up appointment with her OB/GYN.  Patient understands and agrees with plan.  Discharged in stable condition.   Patient's presentation is most consistent with acute complicated illness / injury requiring diagnostic workup.    FINAL CLINICAL IMPRESSION(S) / ED DIAGNOSES   Final diagnoses:  Abdominal pain during pregnancy in first trimester  Bacteriuria during pregnancy     Rx / DC Orders   ED Discharge Orders          Ordered    cephALEXin (KEFLEX) 500 MG capsule  4 times daily        04/28/22 2205             Note:  This document was prepared using Dragon voice recognition software and may include unintentional dictation errors.   Keturah Shavers 04/28/22 2242    Arnaldo Natal, MD 04/29/22 [phone]

## 2022-04-30 LAB — URINE CULTURE

## 2023-05-04 DIAGNOSIS — Z91148 Patient's other noncompliance with medication regimen for other reason: Secondary | ICD-10-CM | POA: Insufficient documentation

## 2023-07-25 ENCOUNTER — Emergency Department
Admission: EM | Admit: 2023-07-25 | Discharge: 2023-07-26 | Discharge status: Against Medical Advice | Payer: Self-pay | Attending: Emergency Medicine | Admitting: Emergency Medicine

## 2023-07-25 ENCOUNTER — Other Ambulatory Visit: Payer: Self-pay

## 2023-07-25 DIAGNOSIS — R110 Nausea: Secondary | ICD-10-CM | POA: Insufficient documentation

## 2023-07-25 DIAGNOSIS — Z5321 Procedure and treatment not carried out due to patient leaving prior to being seen by health care provider: Secondary | ICD-10-CM | POA: Insufficient documentation

## 2023-07-25 DIAGNOSIS — R42 Dizziness and giddiness: Secondary | ICD-10-CM | POA: Insufficient documentation

## 2023-07-25 DIAGNOSIS — N939 Abnormal uterine and vaginal bleeding, unspecified: Secondary | ICD-10-CM | POA: Insufficient documentation

## 2023-07-25 LAB — TYPE AND SCREEN
ABO/RH(D): A POS
Antibody Screen: NEGATIVE

## 2023-07-25 LAB — COMPREHENSIVE METABOLIC PANEL
ALT: 19 U/L (ref 0–44)
AST: 24 U/L (ref 15–41)
Albumin: 4.5 g/dL (ref 3.5–5.0)
Alkaline Phosphatase: 44 U/L (ref 38–126)
Anion gap: 9 (ref 5–15)
BUN: 7 mg/dL (ref 6–20)
CO2: 28 mmol/L (ref 22–32)
Calcium: 8.6 mg/dL — ABNORMAL LOW (ref 8.9–10.3)
Chloride: 97 mmol/L — ABNORMAL LOW (ref 98–111)
Creatinine, Ser: 0.63 mg/dL (ref 0.44–1.00)
GFR, Estimated: >60 mL/min (ref >60)
Glucose, Bld: 79 mg/dL (ref 70–99)
Potassium: 3.6 mmol/L (ref 3.5–5.1)
Sodium: 134 mmol/L — ABNORMAL LOW (ref 135–145)
Total Bilirubin: 0.4 mg/dL (ref <1.2)
Total Protein: 8.4 g/dL — ABNORMAL HIGH (ref 6.5–8.1)

## 2023-07-25 LAB — CBC
HCT: 41.1 % (ref 36.0–46.0)
Hemoglobin: 13.8 g/dL (ref 12.0–15.0)
MCH: 31.7 pg (ref 26.0–34.0)
MCHC: 33.6 g/dL (ref 30.0–36.0)
MCV: 94.3 fL (ref 80.0–100.0)
Platelets: 229 10*3/uL (ref 150–400)
RBC: 4.36 MIL/uL (ref 3.87–5.11)
RDW: 12.7 % (ref 11.5–15.5)
WBC: 8 10*3/uL (ref 4.0–10.5)
nRBC: 0 % (ref 0.0–0.2)

## 2023-07-25 LAB — POC URINE PREG, ED: Preg Test, Ur: NEGATIVE

## 2023-07-25 LAB — URINALYSIS, ROUTINE W REFLEX MICROSCOPIC
Bilirubin Urine: NEGATIVE
Glucose, UA: NEGATIVE mg/dL
Ketones, ur: NEGATIVE mg/dL
Nitrite: NEGATIVE
Protein, ur: NEGATIVE mg/dL
Specific Gravity, Urine: 1.002 — ABNORMAL LOW (ref 1.005–1.030)
pH: 7 (ref 5.0–8.0)

## 2023-07-25 NOTE — ED Triage Notes (Signed)
Reports acute onset vaginal bleeding today. Pt also report nausea and dizziness. Unsure of passage of clots but reports bleeding is bright red. Reports abd pain. Pt is currently being treated for tonsillitis and reports negative strep test at time of diagnosis. Pt does report concern for chlamydia after googling due to correlation of tonsillitis and vaginal bleeding. Pt requesting STD testing. Pt also reports dizziness. Pt ambulatory to triage. Alert and oriented. Breathing unlabored speaking in full sentences.

## 2023-12-02 DIAGNOSIS — B009 Herpesviral infection, unspecified: Secondary | ICD-10-CM | POA: Insufficient documentation

## 2023-12-02 DIAGNOSIS — F332 Major depressive disorder, recurrent severe without psychotic features: Secondary | ICD-10-CM | POA: Insufficient documentation

## 2023-12-23 ENCOUNTER — Ambulatory Visit: Admission: EM | Admit: 2023-12-23 | Discharge: 2023-12-23 | Disposition: A | Discharge status: Home / Self Care

## 2023-12-23 DIAGNOSIS — S61210A Laceration without foreign body of right index finger without damage to nail, initial encounter: Secondary | ICD-10-CM | POA: Diagnosis not present

## 2023-12-23 DIAGNOSIS — Z23 Encounter for immunization: Secondary | ICD-10-CM

## 2023-12-23 MED ORDER — CEPHALEXIN 500 MG PO CAPS: 500.0000 mg | ORAL_CAPSULE | Freq: Three times a day (TID) | ORAL | 0 refills | Status: AC

## 2023-12-23 MED ORDER — TETANUS-DIPHTH-ACELL PERTUSSIS 5-2.5-18.5 LF-MCG/0.5 IM SUSY
0.5000 mL | PREFILLED_SYRINGE | Freq: Once | INTRAMUSCULAR | Status: AC
  Administered 2023-12-23: 0.5 mL via INTRAMUSCULAR
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
## 2023-12-23 NOTE — ED Triage Notes (Signed)
 Pt c/o LAC of R pointer finger that occurred today. States was at work using a Warehouse [employer] & cut finger. Last tetanus 01/02/08.

## 2023-12-23 NOTE — Discharge Instructions (Signed)
 Keep the wound clean and dry and leave the dressing in place for the next 48 hours.  After 48 hours you can remove the outer dressing but make sure that the fabric dressing remains in place as this is the artificial clot.  Wash your finger with warm water and soap, pat it dry, and apply a new nonadherent dressing.  You will change the dressing each day or when it becomes soiled.  Take the Keflex 3 times a day with food for 7 days to prevent infection.  We have updated your tetanus shot tonight.  If you develop any swelling, increased pain, red streak going to finger, pus drainage, or fever please return for reevaluation.
